# Patient Record
Sex: Female | Born: 1955 | Race: White | Hispanic: No | Marital: Married | Smoking: Never smoker
Health system: Southern US, Community
[De-identification: ages and names within clinical notes are randomized; demographics above are authoritative.]

## PROBLEM LIST (undated history)

## (undated) DIAGNOSIS — R319 Hematuria, unspecified: Secondary | ICD-10-CM

## (undated) DIAGNOSIS — G4700 Insomnia, unspecified: Secondary | ICD-10-CM

## (undated) DIAGNOSIS — I10 Essential (primary) hypertension: Secondary | ICD-10-CM

## (undated) DIAGNOSIS — E785 Hyperlipidemia, unspecified: Secondary | ICD-10-CM

## (undated) DIAGNOSIS — N951 Menopausal and female climacteric states: Secondary | ICD-10-CM

## (undated) DIAGNOSIS — M8580 Other specified disorders of bone density and structure, unspecified site: Secondary | ICD-10-CM

## (undated) DIAGNOSIS — K649 Unspecified hemorrhoids: Secondary | ICD-10-CM

## (undated) HISTORY — DX: Hyperlipidemia, unspecified: E78.5

## (undated) HISTORY — PX: BREAST SURGERY: SHX581

## (undated) HISTORY — DX: Essential (primary) hypertension: I10

## (undated) HISTORY — DX: Insomnia, unspecified: G47.00

## (undated) HISTORY — DX: Hematuria, unspecified: R31.9

## (undated) HISTORY — DX: Unspecified hemorrhoids: K64.9

## (undated) HISTORY — PX: BREAST EXCISIONAL BIOPSY: SUR124

## (undated) HISTORY — DX: Other specified disorders of bone density and structure, unspecified site: M85.80

## (undated) HISTORY — PX: ABDOMINAL HYSTERECTOMY: SHX81

## (undated) HISTORY — DX: Menopausal and female climacteric states: N95.1

---

## 2000-05-05 ENCOUNTER — Other Ambulatory Visit: Admission: RE | Admit: 2000-05-05 | Discharge: 2000-05-05 | Payer: Self-pay | Admitting: *Deleted

## 2000-08-19 ENCOUNTER — Other Ambulatory Visit: Admission: RE | Admit: 2000-08-19 | Discharge: 2000-08-19 | Payer: Self-pay | Admitting: *Deleted

## 2001-04-18 ENCOUNTER — Encounter (INDEPENDENT_AMBULATORY_CARE_PROVIDER_SITE_OTHER): Payer: Self-pay

## 2001-04-18 ENCOUNTER — Observation Stay (HOSPITAL_COMMUNITY): Admission: RE | Admit: 2001-04-18 | Discharge: 2001-04-19 | Payer: Self-pay | Admitting: *Deleted

## 2001-12-19 ENCOUNTER — Other Ambulatory Visit: Admission: RE | Admit: 2001-12-19 | Discharge: 2001-12-19 | Payer: Self-pay | Admitting: *Deleted

## 2002-09-18 ENCOUNTER — Encounter: Admission: RE | Admit: 2002-09-18 | Discharge: 2002-09-18 | Payer: Self-pay | Admitting: *Deleted

## 2002-09-18 ENCOUNTER — Encounter: Payer: Self-pay | Admitting: *Deleted

## 2002-12-27 ENCOUNTER — Other Ambulatory Visit: Admission: RE | Admit: 2002-12-27 | Discharge: 2002-12-27 | Payer: Self-pay | Admitting: *Deleted

## 2003-10-11 ENCOUNTER — Encounter: Admission: RE | Admit: 2003-10-11 | Discharge: 2003-10-11 | Payer: Self-pay | Admitting: *Deleted

## 2003-12-26 ENCOUNTER — Other Ambulatory Visit: Admission: RE | Admit: 2003-12-26 | Discharge: 2003-12-26 | Payer: Self-pay | Admitting: *Deleted

## 2004-02-21 ENCOUNTER — Encounter: Admission: RE | Admit: 2004-02-21 | Discharge: 2004-02-21 | Payer: Self-pay | Admitting: Family Medicine

## 2004-11-23 ENCOUNTER — Encounter: Admission: RE | Admit: 2004-11-23 | Discharge: 2004-11-23 | Payer: Self-pay | Admitting: *Deleted

## 2004-12-30 ENCOUNTER — Other Ambulatory Visit: Admission: RE | Admit: 2004-12-30 | Discharge: 2004-12-30 | Payer: Self-pay | Admitting: *Deleted

## 2005-01-08 ENCOUNTER — Encounter: Admission: RE | Admit: 2005-01-08 | Discharge: 2005-01-08 | Payer: Self-pay | Admitting: *Deleted

## 2005-12-22 ENCOUNTER — Encounter: Admission: RE | Admit: 2005-12-22 | Discharge: 2005-12-22 | Payer: Self-pay | Admitting: *Deleted

## 2006-01-04 ENCOUNTER — Other Ambulatory Visit: Admission: RE | Admit: 2006-01-04 | Discharge: 2006-01-04 | Payer: Self-pay | Admitting: *Deleted

## 2006-12-27 ENCOUNTER — Encounter: Admission: RE | Admit: 2006-12-27 | Discharge: 2006-12-27 | Payer: Self-pay | Admitting: *Deleted

## 2007-01-11 ENCOUNTER — Other Ambulatory Visit: Admission: RE | Admit: 2007-01-11 | Discharge: 2007-01-11 | Payer: Self-pay | Admitting: *Deleted

## 2008-02-05 ENCOUNTER — Other Ambulatory Visit: Admission: RE | Admit: 2008-02-05 | Discharge: 2008-02-05 | Payer: Self-pay | Admitting: Gynecology

## 2008-02-05 ENCOUNTER — Encounter: Admission: RE | Admit: 2008-02-05 | Discharge: 2008-02-05 | Payer: Self-pay | Admitting: Gynecology

## 2008-02-09 ENCOUNTER — Encounter: Admission: RE | Admit: 2008-02-09 | Discharge: 2008-02-09 | Payer: Self-pay | Admitting: Gynecology

## 2008-02-14 ENCOUNTER — Encounter: Admission: RE | Admit: 2008-02-14 | Discharge: 2008-02-14 | Payer: Self-pay | Admitting: Gynecology

## 2008-02-14 ENCOUNTER — Encounter (INDEPENDENT_AMBULATORY_CARE_PROVIDER_SITE_OTHER): Payer: Self-pay | Admitting: Diagnostic Radiology

## 2009-03-06 ENCOUNTER — Encounter: Admission: RE | Admit: 2009-03-06 | Discharge: 2009-03-06 | Payer: Self-pay | Admitting: Gynecology

## 2009-03-11 ENCOUNTER — Encounter: Admission: RE | Admit: 2009-03-11 | Discharge: 2009-03-11 | Payer: Self-pay | Admitting: Gynecology

## 2009-03-18 ENCOUNTER — Encounter: Admission: RE | Admit: 2009-03-18 | Discharge: 2009-03-18 | Payer: Self-pay | Admitting: Gynecology

## 2010-03-09 ENCOUNTER — Encounter: Admission: RE | Admit: 2010-03-09 | Discharge: 2010-03-09 | Payer: Self-pay | Admitting: Family Medicine

## 2010-06-15 ENCOUNTER — Encounter: Admission: RE | Admit: 2010-06-15 | Discharge: 2010-06-15 | Payer: Self-pay | Admitting: Family Medicine

## 2010-12-18 NOTE — Op Note (Signed)
[facility]  Patient:    Laura Cohen, Laura Cohen Visit Number: [id number] MRN: [id number]          Service Type: DSU Location: DAY Attending Physician:  Collene Schlichter Proc. Date: 04/18/01 Admit Date:  04/18/2001                             Operative Report  PREOPERATIVE DIAGNOSES:  Abnormal uterine bleeding, uterine enlargement, pelvic pain, persisting ovarian cyst, and endometriosis.  POSTOPERATIVE DIAGNOSES:  Abnormal uterine bleeding, uterine enlargement, pelvic pain, persisting ovarian cyst, and endometriosis pending pathology.  PROCEDURE PERFORMED:  Laparoscopically assisted vaginal hysterectomy, bilateral salpingo-oophorectomy.  SURGEON:  Almedia Balls. Randell Patient, M.D.  ASSISTANT:  Harl Bowie, M.D.  INDICATIONS:  The patient is a 55 year old female with the above noted problems, who was counseled as to the need for surgery to treat these problems.  She was fully counseled as to the nature of the procedure and the risks involved to include risk of anesthesia, injury to bowel, bladder, vessels, and ureter, postoperative hemorrhage, infection, recuperation, and use of hormone replacement following removal of the ovaries, which she definitely desired.  She fully understands all these considerations and wishes to proceed on 04/18/01.  FINDINGS:  On laparoscopy, the lower liver edge, spleen, and area of the appendix were normal to visualization.  The uterus was approximately 10-[redacted] weeks gestational size with multiple myomata present.  Both ovaries had cyst, which appeared to be corpus luteum cyst.  There are adhesions involving the right adnexal structures to the posterolateral peritoneum and cul-de-sac areas.  These were thought to be secondary to probable endometriosis.  DESCRIPTION OF PROCEDURE:  With the patient under general anesthesia, prepared and draped in the usual sterile fashion, with an empty bladder post catheterization and a tenaculum on  the cervix, an incision was made in the lower pole of the umbilicus.  The Veress cannula was inserted into the peritoneal cavity with insufflation of 3 liters of carbon dioxide.  The reuseable 10 mm trocar for the operative scope, and the scope itself was then inserted into the peritoneal cavity.  The reuseable 5 mm probe was inserted through a stab wound just above the symphysis pubis.  The above noted findings were visualized.  Bipolar electrocoagulation was used to render the infundibulopelvic ligaments and lateral peritoneal attachments hemostatic both adnexal areas, these structures were sequentially cut free with good hemostasis throughout.  A bladder flap was developed on the anterior surface of the uterus using bipolar electrocoagulation for hemostasis and sharp dissection for creation of the bladder flap.  At this point, with good hemostasis in the operative area, the patient was reprepared and draped for a vaginal procedure.  A weighted speculum was placed in the posterior vagina and the cervix was grasped with two Loman [place].  A solution of 1% lidocaine with 1:200,000 epinephrine was injected circumferentially on the cervix.  An incision was made from the 3 to 9 oclock positions and the vaginal mucosa anteriorly with dissection of the bladder off the cervix and lower uterine segment.  The posterior cul-de-sac was then entered as well.  Heaney clamps were used to clamp in succession the uterosacral ligaments, cardinal ligaments, and uterine vessels bilaterally, these structures were individually suture ligated after being cut free of the cervix and lower uterine segment.  The anterior cul-de-sac was then entered without difficulty.  It was then possible to invert the uterus, and the remaining portions of broad  ligament were clamped, cut, and ligated with 1-0 chromic catgut.  The area was lavaged with copious amounts of lactated Ringers solution, and after noting that  hemostasis was maintained and that sponge and instrument counts were correct, a modified McCall suture was placed for closure of the posterior cul-de-sac and prevention of enterocele.  The peritoneum was closed with a pursestring suture of #1 chromic catgut.  The vaginal cuff was rendered hemostatic and reapproximated with a continuous interlocking suture of #1 chromic catgut. The patient was then catheterized with free flow of urine, and prepared and draped for a laparoscopy procedure.  After changing gloves, the laparoscope was reinserted into the peritoneal cavity with insufflation of 3 liters of carbon dioxide.  The surgical site was inspected after lavage with copious amounts of lactated Ringers solution for hemostasis.  After noting that this was the case and that sponge and instrument counts were correct, the procedure was terminated.  Gas was allowed to fully escape and the incisions were closed with fascial sutures of 2-0 Vicryl and subcuticular sutures of 3-0 plain catgut.  Estimated blood loss was 250 ml.  The patient was taken to the recovery room in good condition following catheterization and free flow of clear urine.  She will be placed on a 23-hour observation following surgery. Attending Physician:  Collene Schlichter DD:  04/18/01 TD:  04/18/01 Job: [id number]

## 2010-12-18 NOTE — H&P (Signed)
[facility]  Patient:    Laura Cohen, Laura Cohen Visit Number: [id number] MRN: [id number]          Service Type: Attending:  Almedia Balls. Randell Patient, M.D. Dictated by:   Almedia Balls Randell Patient, M.D. Adm. Date:  04/18/01                           History and Physical  CHIEF COMPLAINT:  Pain, uterine enlargement, abnormal bleeding.  HISTORY OF PRESENT ILLNESS:  The patient is a 55 year old gravida 3, para 3, whose last menstrual period was March 31, 2001.  Over the past several years she has had increasingly severe menses with pain and heavy bleeding, and has had continued pressure and pain throughout the month.  Pap smear was normal in October 2001.  She underwent hysteroscopy, D&C, and laparoscopy in January 2002, with findings of extensive endometriosis, ovarian cyst, uterine enlargement.  Pathology was benign on the D&C tissue.  She is admitted at this time for hysterectomy, bilateral salpingo-oophorectomy.  She has been fully counseled as to the nature of the procedure, to include risks, including anesthesia, injury to bowel, bladder, blood vessels, ureters, postoperative hemorrhage, infection, and recuperation.  She fully understands all these considerations, and wishes to proceed on April 18, 2001.  PAST MEDICAL HISTORY:  The above noted surgery.  ALLERGIES:  No known drug allergies.  CURRENT MEDICATIONS:  Has taken multiple analgesics for pain.  FAMILY HISTORY:  Noncontributory.  REVIEW OF SYSTEMS:  HEENT:  Negative.  CARDIORESPIRATORY:  Negative.  GASTROINTESTINAL:  Negative.  GENITOURINARY:  As in present illness.  NEUROMUSCULAR:  Negative.  PHYSICAL EXAMINATION:  VITAL SIGNS:  Height 5 feet 4 inches, weight 162 pounds, blood pressure 132/78, pulse 80, respiratory rate 18.  GENERAL:  A well-developed white female in no acute distress.  HEENT:  Within normal limits.  NECK:  Supple without masses and adenopathy or bruits.  HEART:  Regular rate  and rhythm without murmurs.  LUNGS:  Clear to auscultation and percussion.  BREASTS:  Sitting and laying without mass.  Axilla negative.  ABDOMEN:  Flat and soft without mass, slightly tender in lower quadrants.  PELVIC:  External genitalia, Bartholins, urethra, and Skeens glands within normal limits.  Cervix slightly inflamed.  Uterus mid position approximately [redacted] weeks gestational size, irregular, no definite palpable adnexal masses, but tender right greater than left.  Anterior and posterior cul-de-sac examination is confirmatory.  EXTREMITIES:  Within normal limits.  SKIN:  Without suspicious lesions.  IMPRESSION:  Uterine enlargement, abnormal uterine bleeding, pelvic pain.  DISPOSITION:  As noted above. Dictated by:   Almedia Balls Randell Patient, M.D. Attending:  Almedia Balls. Randell Patient, M.D. DD:  04/13/01 TD:  04/13/01 Job: [id number]

## 2010-12-18 NOTE — Discharge Summary (Signed)
[facility]  Patient:    Laura Cohen, Laura Cohen Visit Number: [id number] MRN: [id number]          Service Type: SUR Location: 4W 0451 01 Attending Physician:  Collene Schlichter Dictated by:   Almedia Balls Randell Patient, M.D. Admit Date:  04/18/2001 Disc. Date: 04/19/01                             Discharge Summary  HISTORY:  The patient is a 55 year old with uterine enlargement, abnormal uterine bleeding, pelvic pain, endometriosis, recurring ovarian cyst for hysterectomy, bilateral salpingo-oophorectomy. The remainder of her history and physical are as previously dictated.  LABORATORY DATA:  Preoperative hemoglobin 15 grams.  HOSPITAL COURSE:  The patient was taken to the operating room on April 18, 2001 at which time laparoscopically assisted vaginal hysterectomy, bilateral salpingo-oophorectomy were performed. The patient did well postoperatively. Diet and ambulation were progressed over the evening of September 17 and early morning of September 18.  On the morning of September 18, she was afebrile, up and voiding without difficulty, and experiencing no problems other than pain which was relieved by pain medications orally. It was felt that she could be discharged at this time.  FINAL DIAGNOSES:              1. Uterine enlargement.                               2. Abnormal uterine bleeding.                               3. History of endometriosis.                               4. Pelvic pain.                               5. Recurring ovarian cyst.  OPERATION:                    Laparoscopically assisted vaginal hysterectomy and bilateral salpingo-oophorectomy.  PATHOLOGY REPORT:             Unavailable at the time of dictation.  DISPOSITION:                  Discharged home.  DISCHARGE FOLLOWUP:           The patient is to return to the office in two weeks for followup.  DISCHARGE INSTRUCTIONS:       The patient was instructed to gradually  progress her activities over several weeks at home and to limit lifting and driving for two weeks. She will call for any problems.  CONDITION ON DISCHARGE        She was fully ambulatory, on a regular diet, and in good condition at the time of discharge.  DISCHARGE MEDICATIONS:        The patient had Darvocet-N 100 at home and was advised to combine this with Ibuprofen for pain relief. She was given a prescription for doxycycline 100 mg, #12, to be taken one b.i.d. Dictated by:   Almedia Balls Randell Patient, M.D. Attending Physician:  Collene Schlichter DD:  04/19/01 TD:  04/19/01 Job: [id number]

## 2011-03-25 ENCOUNTER — Other Ambulatory Visit: Payer: Self-pay | Admitting: Family Medicine

## 2011-03-25 DIAGNOSIS — Z1231 Encounter for screening mammogram for malignant neoplasm of breast: Secondary | ICD-10-CM

## 2011-04-13 ENCOUNTER — Ambulatory Visit: Payer: Self-pay

## 2011-04-15 ENCOUNTER — Ambulatory Visit: Payer: Self-pay

## 2011-05-04 ENCOUNTER — Ambulatory Visit
Admission: RE | Admit: 2011-05-04 | Discharge: 2011-05-04 | Disposition: A | Discharge status: Home / Self Care | Payer: No Typology Code available for payment source | Source: Ambulatory Visit | Attending: Family Medicine | Admitting: Family Medicine

## 2011-05-04 DIAGNOSIS — Z1231 Encounter for screening mammogram for malignant neoplasm of breast: Secondary | ICD-10-CM

## 2012-04-24 ENCOUNTER — Other Ambulatory Visit: Payer: Self-pay | Admitting: Family Medicine

## 2012-04-24 DIAGNOSIS — Z1231 Encounter for screening mammogram for malignant neoplasm of breast: Secondary | ICD-10-CM

## 2012-05-29 ENCOUNTER — Ambulatory Visit: Payer: No Typology Code available for payment source

## 2012-05-29 ENCOUNTER — Ambulatory Visit
Admission: RE | Admit: 2012-05-29 | Discharge: 2012-05-29 | Disposition: A | Discharge status: Home / Self Care | Payer: No Typology Code available for payment source | Source: Ambulatory Visit | Attending: Family Medicine | Admitting: Family Medicine

## 2012-05-29 DIAGNOSIS — Z1231 Encounter for screening mammogram for malignant neoplasm of breast: Secondary | ICD-10-CM

## 2012-06-26 ENCOUNTER — Other Ambulatory Visit: Payer: Self-pay | Admitting: Family Medicine

## 2012-06-26 DIAGNOSIS — M8580 Other specified disorders of bone density and structure, unspecified site: Secondary | ICD-10-CM

## 2012-06-28 ENCOUNTER — Ambulatory Visit
Admission: RE | Admit: 2012-06-28 | Discharge: 2012-06-28 | Disposition: A | Discharge status: Home / Self Care | Payer: No Typology Code available for payment source | Source: Ambulatory Visit | Attending: Family Medicine | Admitting: Family Medicine

## 2012-06-28 DIAGNOSIS — M8580 Other specified disorders of bone density and structure, unspecified site: Secondary | ICD-10-CM

## 2013-02-27 ENCOUNTER — Encounter: Payer: Self-pay | Admitting: Dietician

## 2013-02-27 ENCOUNTER — Encounter: Payer: No Typology Code available for payment source | Attending: Family Medicine | Admitting: Dietician

## 2013-02-27 VITALS — Ht 63.0 in | Wt 159.6 lb

## 2013-02-27 DIAGNOSIS — E785 Hyperlipidemia, unspecified: Secondary | ICD-10-CM | POA: Insufficient documentation

## 2013-02-27 DIAGNOSIS — Z713 Dietary counseling and surveillance: Secondary | ICD-10-CM | POA: Insufficient documentation

## 2013-02-27 DIAGNOSIS — E663 Overweight: Secondary | ICD-10-CM | POA: Insufficient documentation

## 2013-02-27 NOTE — Patient Instructions (Signed)
Eat 3 meals per day and several snacks if needed.  Pay attention to hunger and fullness cues. Buy fruits and vegetables and have it prepared "ready to go the refrigerator". Eat in proportion to MyPlate guidelines. Check labels to limit saturated fat and avoid trans fats. Consider limiting diet coke for bone health. Add physical activity you like. Eat foods with calcium and talk to your calcium/vitamin D supplements.

## 2013-02-27 NOTE — Progress Notes (Signed)
Medical Nutrition Therapy:  Appt start time: 1015 end time:  1115.   Assessment:  Primary concerns today: Referred for high cholesterol and would prefer not to be on medication since the medication made her body ache. Is currently taking Fosamax for osteopenia which is upsetting stomach so is going to talk to the doctor about changing the dose or medication. Laura Cohen is also interested in losing about 20 pounds. She states that she gained some weight over the years with a high of 174 pounds. Last summer she lost about 10 pounds on Weight Watchers but reports that she lost motivation over the winter.  Laura Cohen works at [employer] of [place] and lives with with husband and states that she does the food shopping and cooking. States that she knows what to do to be healthy but has difficulty with finding the motivation to plan and prepare meals. Claims to be a "picky eater" with a sensitive stomach. Will sometimes skip lunch and will be very hungry after work.   Currently walks about 25 minutes a few times a week, but would like to find the motivation to do more[place] a buddy to exercise with.    MEDICATIONS: See list   DIETARY INTAKE:  Everyday foods include diet coke.   Avoided foods include most vegetables, green beans, fish   24-hr recall:  B ( AM): steel cut oatmeal with coffee with fat free half and half, stevia, and splenda, pomegranate juice  Snk ( AM): crackers or a piece of fruit, granola bar, [place] yogurt sometimes nothing  L ( PM): salad with cucumber, radishes, dried cranberries, sometimes chicken, fat free dressing, [place] style OR boneless chicken wings with BBQ sauce and chips and diet coke  Snk ( PM): sometimes crackers with peanut butter or chips  D ( PM): macaroni and cheese, pork chop, pinto beans, hot dog, pizza with decaf tea with splenda and sugar Snk ( PM): sometimes fruit, cheddar and carmel popocorn Beverages: drinks 3, 16 oz diet  cokes each day, sometimes sparkling water  Usual physical activity: walk for 25 minutes 2 x week  Estimated energy needs: 1600 calories 180 g carbohydrates 120 g protein 44 g fat  Progress Towards Goal(s):  In progress.   Nutritional Diagnosis:  NB-1.1 Food and nutrition-related knowledge deficit As related to meal skipping, lack of meal planning,  and energy dense food choices.  As evidenced by hyperlipidemia and BMI of 28.3.    Intervention:  Nutrition counseling provided. Discussed limiting diet coke intake d/t impact on bones, adding weight bearing exercises, planning for meals and snacks in advance so that healthy foods are available to "grab and go". Emphasized the importance of having three meals and snacks each day to prevent late day hunger.  Discussed eating meals in proportion to MyPlate and including plenty of calcium rich foods for bone health. Recommended discussing calcium and vitamin D supplements with doctor. Reviewed the food label and advised to avoid trans fat, limit saturated fats, and add heart healthy fats from walnuts and flax seeds since she doesn't like fish.   Plan: Eat 3 meals per day and several snacks if needed.  Pay attention to hunger and fullness cues. Buy fruits and vegetables and have it prepared "ready to go the refrigerator". Eat in proportion to MyPlate guidelines. Check labels to limit saturated fat and avoid trans fats. Consider limiting diet coke for bone health. Add physical activity you like. Eat foods  with calcium and talk to your calcium/vitamin D supplements.   Handouts given during visit include:  MyPlate Handout  High Cholesterol Diet from eatright.org  Food label Handout  High Calcium Foods  Monitoring/Evaluation:  Dietary intake, exercise, and body weight in 4 week(s).

## 2013-03-27 ENCOUNTER — Ambulatory Visit: Payer: No Typology Code available for payment source | Admitting: Dietician

## 2013-04-30 ENCOUNTER — Other Ambulatory Visit: Payer: Self-pay

## 2013-04-30 DIAGNOSIS — Z1231 Encounter for screening mammogram for malignant neoplasm of breast: Secondary | ICD-10-CM

## 2013-05-30 ENCOUNTER — Ambulatory Visit
Admission: RE | Admit: 2013-05-30 | Discharge: 2013-05-30 | Disposition: A | Discharge status: Home / Self Care | Payer: No Typology Code available for payment source | Source: Ambulatory Visit

## 2013-05-30 DIAGNOSIS — Z1231 Encounter for screening mammogram for malignant neoplasm of breast: Secondary | ICD-10-CM

## 2013-06-11 ENCOUNTER — Other Ambulatory Visit: Payer: Self-pay | Admitting: Family Medicine

## 2013-06-11 DIAGNOSIS — R928 Other abnormal and inconclusive findings on diagnostic imaging of breast: Secondary | ICD-10-CM

## 2013-06-12 ENCOUNTER — Ambulatory Visit
Admission: RE | Admit: 2013-06-12 | Discharge: 2013-06-12 | Disposition: A | Discharge status: Home / Self Care | Payer: No Typology Code available for payment source | Source: Ambulatory Visit | Attending: Family Medicine | Admitting: Family Medicine

## 2013-06-12 DIAGNOSIS — R928 Other abnormal and inconclusive findings on diagnostic imaging of breast: Secondary | ICD-10-CM

## 2013-06-29 ENCOUNTER — Other Ambulatory Visit: Payer: No Typology Code available for payment source

## 2014-02-16 ENCOUNTER — Emergency Department (HOSPITAL_COMMUNITY): Payer: No Typology Code available for payment source

## 2014-02-16 ENCOUNTER — Emergency Department (HOSPITAL_COMMUNITY)
Admission: EM | Admit: 2014-02-16 | Discharge: 2014-02-16 | Disposition: A | Discharge status: Home / Self Care | Payer: No Typology Code available for payment source | Attending: Emergency Medicine | Admitting: Emergency Medicine

## 2014-02-16 ENCOUNTER — Encounter (HOSPITAL_COMMUNITY): Payer: Self-pay | Admitting: Emergency Medicine

## 2014-02-16 DIAGNOSIS — R209 Unspecified disturbances of skin sensation: Secondary | ICD-10-CM | POA: Insufficient documentation

## 2014-02-16 DIAGNOSIS — I10 Essential (primary) hypertension: Secondary | ICD-10-CM | POA: Insufficient documentation

## 2014-02-16 DIAGNOSIS — Z862 Personal history of diseases of the blood and blood-forming organs and certain disorders involving the immune mechanism: Secondary | ICD-10-CM | POA: Insufficient documentation

## 2014-02-16 DIAGNOSIS — Z8639 Personal history of other endocrine, nutritional and metabolic disease: Secondary | ICD-10-CM | POA: Insufficient documentation

## 2014-02-16 DIAGNOSIS — H5710 Ocular pain, unspecified eye: Secondary | ICD-10-CM | POA: Insufficient documentation

## 2014-02-16 DIAGNOSIS — H579 Unspecified disorder of eye and adnexa: Secondary | ICD-10-CM | POA: Insufficient documentation

## 2014-02-16 DIAGNOSIS — Z79899 Other long term (current) drug therapy: Secondary | ICD-10-CM | POA: Insufficient documentation

## 2014-02-16 LAB — CBC
HCT: 42.1 % (ref 36.0–46.0)
Hemoglobin: 14.2 g/dL (ref 12.0–15.0)
MCH: 30.5 pg (ref 26.0–34.0)
MCHC: 33.7 g/dL (ref 30.0–36.0)
MCV: 90.3 fL (ref 78.0–100.0)
Platelets: 252 10*3/uL (ref 150–400)
RBC: 4.66 MIL/uL (ref 3.87–5.11)
RDW: 12.9 % (ref 11.5–15.5)
WBC: 9.3 10*3/uL (ref 4.0–10.5)

## 2014-02-16 LAB — COMPREHENSIVE METABOLIC PANEL
ALT: 18 U/L (ref 0–35)
AST: 23 U/L (ref 0–37)
Albumin: 3.8 g/dL (ref 3.5–5.2)
Alkaline Phosphatase: 79 U/L (ref 39–117)
Anion gap: 20 — ABNORMAL HIGH (ref 5–15)
BUN: 11 mg/dL (ref 6–23)
CO2: 24 mEq/L (ref 19–32)
Calcium: 9.1 mg/dL (ref 8.4–10.5)
Chloride: 94 mEq/L — ABNORMAL LOW (ref 96–112)
Creatinine, Ser: 0.65 mg/dL (ref 0.50–1.10)
GFR calc Af Amer: >90 mL/min (ref >90)
GFR calc non Af Amer: >90 mL/min (ref >90)
Glucose, Bld: 111 mg/dL — ABNORMAL HIGH (ref 70–99)
Potassium: 3.4 mEq/L — ABNORMAL LOW (ref 3.7–5.3)
Sodium: 138 mEq/L (ref 137–147)
Total Bilirubin: 0.2 mg/dL — ABNORMAL LOW (ref 0.3–1.2)
Total Protein: 7.3 g/dL (ref 6.0–8.3)

## 2014-02-16 LAB — I-STAT VENOUS BLOOD GAS, ED
Acid-Base Excess: 3 mmol/L — ABNORMAL HIGH (ref 0.0–2.0)
Bicarbonate: 28.1 mEq/L — ABNORMAL HIGH (ref 20.0–24.0)
O2 Saturation: 41 %
TCO2: 29 mmol/L (ref 0–100)
pCO2, Ven: 45.2 mmHg (ref 45.0–50.0)
pH, Ven: 7.402 — ABNORMAL HIGH (ref 7.250–7.300)
pO2, Ven: 23 mmHg — CL (ref 30.0–45.0)

## 2014-02-16 LAB — I-STAT TROPONIN, ED: Troponin i, poc: 0.01 ng/mL (ref 0.00–0.08)

## 2014-02-16 LAB — DIFFERENTIAL
Basophils Absolute: 0 10*3/uL (ref 0.0–0.1)
Basophils Relative: 0 % (ref 0–1)
Eosinophils Absolute: 0.1 10*3/uL (ref 0.0–0.7)
Eosinophils Relative: 1 % (ref 0–5)
Lymphocytes Relative: 14 % (ref 12–46)
Lymphs Abs: 1.3 10*3/uL (ref 0.7–4.0)
Monocytes Absolute: 0.6 10*3/uL (ref 0.1–1.0)
Monocytes Relative: 6 % (ref 3–12)
Neutro Abs: 7.4 10*3/uL (ref 1.7–7.7)
Neutrophils Relative %: 79 % — ABNORMAL HIGH (ref 43–77)

## 2014-02-16 LAB — APTT: aPTT: 32 seconds (ref 24–37)

## 2014-02-16 LAB — PROTIME-INR
INR: 0.97 (ref 0.00–1.49)
Prothrombin Time: 12.9 seconds (ref 11.6–15.2)

## 2014-02-16 LAB — CBG MONITORING, ED: Glucose-Capillary: 107 mg/dL — ABNORMAL HIGH (ref 70–99)

## 2014-02-16 MED ORDER — PSEUDOEPHEDRINE HCL 60 MG PO TABS
60.0000 mg | ORAL_TABLET | Freq: Once | ORAL | Status: DC
  Filled 2014-02-16 (×2): qty 1

## 2014-02-16 MED ORDER — TETRACAINE HCL 0.5 % OP SOLN
2.0000 [drp] | Freq: Once | OPHTHALMIC | Status: DC
  Filled 2014-02-16: qty 2

## 2014-02-16 NOTE — ED Provider Notes (Signed)
CSN: [id number]     Arrival date & time 02/16/14  1415 History   First MD Initiated Contact with Patient 02/16/14 1604     Chief Complaint  Patient presents with  . Weakness  . Blurred Vision     (Consider location/radiation/quality/duration/timing/severity/associated sxs/prior Treatment) Patient is a 58 y.o. female presenting with eye pain.  Eye Pain This is a new problem. The current episode started today. The problem occurs constantly. The problem has been resolved. Pertinent negatives include no abdominal pain, chest pain, congestion, coughing, headaches, nausea or vomiting. Exacerbated by: screen time     Past Medical History  Diagnosis Date  . Hypertension   . Hyperlipidemia    Past Surgical History  Procedure Laterality Date  . Breast surgery    . Abdominal hysterectomy     Family History  Problem Relation Age of Onset  . Hypertension Other    History  Substance Use Topics  . Smoking status: Not on file  . Smokeless tobacco: Not on file  . Alcohol Use: No   OB History   Grav Para Term Preterm Abortions TAB SAB Ect Mult Living                 Review of Systems  Constitutional: Negative for activity change.  HENT: Negative for congestion.   Eyes: Positive for pain and visual disturbance.       Eye pressure   Respiratory: Negative for cough and shortness of breath.   Cardiovascular: Negative for chest pain and leg swelling.  Gastrointestinal: Negative for nausea, vomiting, abdominal pain, diarrhea, constipation, blood in stool and abdominal distention.  Genitourinary: Negative for dysuria, flank pain and vaginal discharge.  Musculoskeletal: Negative for back pain.  Skin: Negative for color change.  Neurological: Negative for syncope and headaches.  Psychiatric/Behavioral: Negative for agitation.  All other systems reviewed and are negative.     Allergies  Review of patient's allergies indicates no known allergies.  Home Medications   Prior to  Admission medications   Medication Sig Start Date End Date Taking? Authorizing Provider  estradiol (VIVELLE-DOT) 0.025 MG/24HR Place 1 patch onto the skin 2 (two) times a week.   Yes Historical Provider, MD  ibuprofen (ADVIL,MOTRIN) 200 MG tablet Take 400 mg by mouth every 6 (six) hours as needed.   Yes Historical Provider, MD  lisinopril-hydrochlorothiazide (PRINZIDE,ZESTORETIC) 10-12.5 MG per tablet Take 1 tablet by mouth daily. 01/18/14  Yes Historical Provider, MD  pseudoephedrine (SUDAFED) 60 MG tablet Take 60 mg by mouth daily as needed for congestion.   Yes Historical Provider, MD   BP 125/57  Pulse 83  Temp(Src) 99.3 F (37.4 C) (Oral)  Resp 20  Ht 5\' 3"  (1.6 m)  Wt 160 lb (72.576 kg)  BMI 28.35 kg/m2  SpO2 98% Physical Exam  Constitutional: She is oriented to person, place, and time. She appears well-developed.  HENT:  Head: Normocephalic.  Eyes: Pupils are equal, round, and reactive to light.  Neck: Neck supple.  Cardiovascular: Normal rate.  Exam reveals no gallop and no friction rub.   No murmur heard. Pulmonary/Chest: Effort normal and breath sounds normal. No respiratory distress.  Abdominal: Soft. She exhibits no distension. There is no tenderness. There is no rebound.  Musculoskeletal: She exhibits no edema.  Neurological: She is alert and oriented to person, place, and time.  Skin: Skin is warm.  Psychiatric: She has a normal mood and affect.   Cranial nerves III-XII grossly intact Strength 5+/5+ to upper and lower extremities  bilaterally with resistance applied, equal distribution noted Strength intact to MCP, PIP, DIP joints of  hand Negative arm drift Fine motor skills intact Heel to knee down shin normal bilaterally Gait proper, proper balance - negative sway, negative drift, negative step-offs  Snell 20/30 both eyes, test independently  ED Course  Procedures (including critical care time) Labs Review Labs Reviewed  DIFFERENTIAL - Abnormal; Notable for  the following:    Neutrophils Relative % 79 (*)    All other components within normal limits  COMPREHENSIVE METABOLIC PANEL - Abnormal; Notable for the following:    Potassium 3.4 (*)    Chloride 94 (*)    Glucose, Bld 111 (*)    Total Bilirubin 0.2 (*)    Anion gap 20 (*)    All other components within normal limits  CBG MONITORING, ED - Abnormal; Notable for the following:    Glucose-Capillary 107 (*)    All other components within normal limits  I-STAT VENOUS BLOOD GAS, ED - Abnormal; Notable for the following:    pH, Ven 7.402 (*)    pO2, Ven 23.0 (*)    Bicarbonate 28.1 (*)    Acid-Base Excess 3.0 (*)    All other components within normal limits  PROTIME-INR  APTT  CBC  BLOOD GAS, VENOUS  I-STAT TROPOININ, ED    Imaging Review Ct Head (brain) Wo Contrast  02/16/2014   CLINICAL DATA:  Blurred vision, left arm weakness.  EXAM: CT HEAD WITHOUT CONTRAST  TECHNIQUE: Contiguous axial images were obtained from the base of the skull through the vertex without intravenous contrast.  COMPARISON:  None.  FINDINGS: Bony calvarium appears intact. No mass effect or midline shift is noted. Ventricular size is within normal limits. There is no evidence of mass lesion, hemorrhage or acute infarction.  IMPRESSION: Normal head CT.   Electronically Signed   By: Sabino Dick M.D.   On: 02/16/2014 15:40     EKG Interpretation None      MDM   Final diagnoses:  Eye pressure   58 year old female with past medical history of hypertension and hyperlipidemia the presents here with addition change in her left eye. Patient change with sudden onset while she was looking at pictures on the phone. Patient states that she has central area of vision blurriness that lasted for approximately 30 minutes. Patient also had some numbness and tingling sensation going down both arms at this time. The numbness and tingling has improved but has not completely resolved the presentation. The blurry vision has  resolved. Patient also endorses some mild pressure-like pain of the eye. Patient is able to read a Snell chart left and right eyes tested independently. Full neuro exam perform without focal neuro deficit. Funduscopic exam does not show obvious blunting of the optic disc or obvious vessel abnormality. Patient evaluated with a head CT which demonstrated no acute abnormality. CMP does demonstrate an anion gap with hypokalemia. The hypokalemia it is very mild patient given by mouth potassium in the department instructed to eat foods higher in potassium at home. VBG ordered to evaluate an ion gap and the patient is noted to not have acidosis.  I had wanted to evaluate the patient with Tono-Pen intraocular pressure. However, patient states that she does not feel that she can tolerate the procedure. Decision making was performed and wrist the benefits were discussed. Patient states that she has an ophthalmologist and will followup as soon as possible. Patient describes relatively minor symptoms and otherwise has a  benign exam. Ocular movements intact without any pain. Patient given return precautions and instructed to please present again if any worsening symptoms. At this time the patient was discharged home    Claudean Severance, MD 02/18/14 [phone]

## 2014-02-16 NOTE — ED Notes (Signed)
Abnormal labs given to Dr Wilson Singer and RN Hassan Rowan

## 2014-02-16 NOTE — ED Notes (Signed)
Pt. Stated, i was sitting under a shelter fling through digital pictures and I notice my left eye began blurry and I had some numbness.  The vision is better but some better and the tingling and numbness is still in my left arm and hand.

## 2014-02-21 NOTE — ED Provider Notes (Signed)
I saw and evaluated the patient, reviewed the resident's note and I agree with the findings and plan.   EKG Interpretation   Date/Time:  Saturday February 16 2014 14:19:13 EDT Ventricular Rate:  73 PR Interval:  122 QRS Duration: 94 QT Interval:  412 QTC Calculation: 453 R Axis:   42 Text Interpretation:  Normal sinus rhythm Nonspecific ST and T wave  abnormality Abnormal ECG ED PHYSICIAN INTERPRETATION AVAILABLE IN [facility] Confirmed by TEST, Record ([id number]) on 02/18/2014 7:17:61 AM      58 year old female with some change in blurred vision and pressure in her left eye. Currently resolved. She does not describe amaurosis fugax. Her examination is unremarkable. No known history of glaucoma. CT head without acute abnormality. Patient did not tolerate testing of intraocular pressure. Tactile tonometry seem normal and symmetric in both eyes for what it's worth. Feel she is stable for discharge at this time. I doubt that this is a TIA. Return precautions were discussed. Ophthalmology followup if has recurrent symptoms.  Virgel Manifold, MD 02/21/14 1236

## 2014-05-16 ENCOUNTER — Other Ambulatory Visit: Payer: Self-pay

## 2014-05-16 DIAGNOSIS — Z1231 Encounter for screening mammogram for malignant neoplasm of breast: Secondary | ICD-10-CM

## 2014-06-06 ENCOUNTER — Ambulatory Visit: Payer: No Typology Code available for payment source

## 2014-06-19 ENCOUNTER — Ambulatory Visit
Admission: RE | Admit: 2014-06-19 | Discharge: 2014-06-19 | Disposition: A | Discharge status: Home / Self Care | Payer: 59 | Source: Ambulatory Visit

## 2014-06-19 DIAGNOSIS — Z1231 Encounter for screening mammogram for malignant neoplasm of breast: Secondary | ICD-10-CM

## 2014-07-31 ENCOUNTER — Other Ambulatory Visit: Payer: Self-pay | Admitting: Family Medicine

## 2014-07-31 DIAGNOSIS — M8580 Other specified disorders of bone density and structure, unspecified site: Secondary | ICD-10-CM

## 2014-08-12 ENCOUNTER — Ambulatory Visit
Admission: RE | Admit: 2014-08-12 | Discharge: 2014-08-12 | Disposition: A | Discharge status: Home / Self Care | Payer: 59 | Source: Ambulatory Visit | Attending: Family Medicine | Admitting: Family Medicine

## 2014-08-12 DIAGNOSIS — M8580 Other specified disorders of bone density and structure, unspecified site: Secondary | ICD-10-CM

## 2014-08-14 ENCOUNTER — Ambulatory Visit (INDEPENDENT_AMBULATORY_CARE_PROVIDER_SITE_OTHER): Payer: 59 | Admitting: Podiatry

## 2014-08-14 ENCOUNTER — Encounter: Payer: Self-pay | Admitting: Podiatry

## 2014-08-14 VITALS — BP 123/75 | HR 77 | Resp 16 | Ht 63.0 in | Wt 170.0 lb

## 2014-08-14 DIAGNOSIS — L600 Ingrowing nail: Secondary | ICD-10-CM

## 2014-08-14 NOTE — Progress Notes (Signed)
   Subjective:    Patient ID: Laura Cohen, female    DOB: 1956/03/12, 59 y.o.   MRN: [id number]  HPI Comments: i have ingrown toenails in both big toes, i have fungus on my little toenail right foot and i had plantar fasciitis on my left foot 1 yr ago. i have had the ingrown nails for 10 yrs. Its worse. i dont know how long ive had the fungus. i have pedicures. i trim my nails. The big toenail on my left foot hurts. i may be interested in the laser. i do not want anything cut or filed today. i do not want any x-rays for the heel because it no longer hurts. All i want is information today.     Review of Systems  Gastrointestinal:       Blood in urine  All other systems reviewed and are negative.      Objective:   Physical Exam: I have reviewed her past medical history medications allergy surgery social history and review of systems. Pulses are strongly palpable bilateral. Neurologic sensorium is intact to Semmes-Weinstein monofilament. Deep tendon reflexes are intact bilateral. Muscle strength is 5 over 5 dorsiflexion plantar flexors and inverters and everters all edges of musculature is intact. Orthopedic evaluation demonstrates mild HAV deformity hammertoe deformities bilateral. Sharp incurvated nail margins to the tibial border of the hallux left with really tender on palpation. No signs of cutaneous mycotic skin infection.        Assessment & Plan:  Assessment: Ingrown nail hallux left.  Plan: I may suggestions perhaps she could take care of this. She did not want to have anything done today.

## 2015-08-13 ENCOUNTER — Other Ambulatory Visit: Payer: Self-pay | Admitting: Family Medicine

## 2015-08-13 DIAGNOSIS — Z1231 Encounter for screening mammogram for malignant neoplasm of breast: Secondary | ICD-10-CM

## 2015-08-27 ENCOUNTER — Ambulatory Visit
Admission: RE | Admit: 2015-08-27 | Discharge: 2015-08-27 | Disposition: A | Discharge status: Home / Self Care | Payer: 59 | Source: Ambulatory Visit | Attending: Family Medicine | Admitting: Family Medicine

## 2015-08-27 DIAGNOSIS — Z1231 Encounter for screening mammogram for malignant neoplasm of breast: Secondary | ICD-10-CM

## 2016-06-11 ENCOUNTER — Other Ambulatory Visit: Payer: Self-pay | Admitting: Family Medicine

## 2016-06-11 DIAGNOSIS — Z1231 Encounter for screening mammogram for malignant neoplasm of breast: Secondary | ICD-10-CM

## 2016-08-06 DIAGNOSIS — Z20828 Contact with and (suspected) exposure to other viral communicable diseases: Secondary | ICD-10-CM | POA: Diagnosis not present

## 2016-09-01 ENCOUNTER — Ambulatory Visit
Admission: RE | Admit: 2016-09-01 | Discharge: 2016-09-01 | Disposition: A | Discharge status: Home / Self Care | Payer: 59 | Source: Ambulatory Visit | Attending: Family Medicine | Admitting: Family Medicine

## 2016-09-01 DIAGNOSIS — Z1231 Encounter for screening mammogram for malignant neoplasm of breast: Secondary | ICD-10-CM

## 2016-09-02 ENCOUNTER — Other Ambulatory Visit: Payer: Self-pay | Admitting: Family Medicine

## 2016-09-02 DIAGNOSIS — R928 Other abnormal and inconclusive findings on diagnostic imaging of breast: Secondary | ICD-10-CM

## 2016-09-06 ENCOUNTER — Other Ambulatory Visit: Payer: Self-pay | Admitting: Family Medicine

## 2016-09-06 ENCOUNTER — Telehealth: Payer: Self-pay | Admitting: Diagnostic Radiology

## 2016-09-06 ENCOUNTER — Ambulatory Visit
Admission: RE | Admit: 2016-09-06 | Discharge: 2016-09-06 | Disposition: A | Discharge status: Home / Self Care | Payer: 59 | Source: Ambulatory Visit | Attending: Family Medicine | Admitting: Family Medicine

## 2016-09-06 DIAGNOSIS — N6311 Unspecified lump in the right breast, upper outer quadrant: Secondary | ICD-10-CM | POA: Diagnosis not present

## 2016-09-06 DIAGNOSIS — R928 Other abnormal and inconclusive findings on diagnostic imaging of breast: Secondary | ICD-10-CM

## 2016-09-06 DIAGNOSIS — N6001 Solitary cyst of right breast: Secondary | ICD-10-CM | POA: Diagnosis not present

## 2016-09-06 DIAGNOSIS — R921 Mammographic calcification found on diagnostic imaging of breast: Secondary | ICD-10-CM | POA: Diagnosis not present

## 2016-09-06 DIAGNOSIS — N6313 Unspecified lump in the right breast, lower outer quadrant: Secondary | ICD-10-CM | POA: Diagnosis not present

## 2016-09-08 ENCOUNTER — Other Ambulatory Visit: Payer: Self-pay | Admitting: Family Medicine

## 2016-09-08 ENCOUNTER — Ambulatory Visit
Admission: RE | Admit: 2016-09-08 | Discharge: 2016-09-08 | Disposition: A | Discharge status: Home / Self Care | Payer: 59 | Source: Ambulatory Visit | Attending: Family Medicine | Admitting: Family Medicine

## 2016-09-08 DIAGNOSIS — N6011 Diffuse cystic mastopathy of right breast: Secondary | ICD-10-CM | POA: Diagnosis not present

## 2016-09-08 DIAGNOSIS — R921 Mammographic calcification found on diagnostic imaging of breast: Secondary | ICD-10-CM

## 2016-09-29 DIAGNOSIS — I10 Essential (primary) hypertension: Secondary | ICD-10-CM | POA: Diagnosis not present

## 2016-09-29 DIAGNOSIS — Z0000 Encounter for general adult medical examination without abnormal findings: Secondary | ICD-10-CM | POA: Diagnosis not present

## 2016-09-30 ENCOUNTER — Other Ambulatory Visit: Payer: Self-pay | Admitting: Family Medicine

## 2016-09-30 DIAGNOSIS — M8588 Other specified disorders of bone density and structure, other site: Secondary | ICD-10-CM

## 2016-10-13 ENCOUNTER — Ambulatory Visit
Admission: RE | Admit: 2016-10-13 | Discharge: 2016-10-13 | Disposition: A | Discharge status: Home / Self Care | Payer: 59 | Source: Ambulatory Visit | Attending: Family Medicine | Admitting: Family Medicine

## 2016-10-13 DIAGNOSIS — M8588 Other specified disorders of bone density and structure, other site: Secondary | ICD-10-CM

## 2016-10-13 DIAGNOSIS — Z780 Asymptomatic menopausal state: Secondary | ICD-10-CM | POA: Diagnosis not present

## 2017-01-03 DIAGNOSIS — D122 Benign neoplasm of ascending colon: Secondary | ICD-10-CM | POA: Diagnosis not present

## 2017-01-03 DIAGNOSIS — Z86010 Personal history of colonic polyps: Secondary | ICD-10-CM | POA: Diagnosis not present

## 2017-01-03 DIAGNOSIS — K648 Other hemorrhoids: Secondary | ICD-10-CM | POA: Diagnosis not present

## 2017-01-20 DIAGNOSIS — E782 Mixed hyperlipidemia: Secondary | ICD-10-CM | POA: Diagnosis not present

## 2017-02-14 ENCOUNTER — Other Ambulatory Visit: Payer: Self-pay | Admitting: Family Medicine

## 2017-02-14 DIAGNOSIS — R921 Mammographic calcification found on diagnostic imaging of breast: Secondary | ICD-10-CM

## 2017-03-16 ENCOUNTER — Ambulatory Visit
Admission: RE | Admit: 2017-03-16 | Discharge: 2017-03-16 | Disposition: A | Discharge status: Home / Self Care | Payer: 59 | Source: Ambulatory Visit | Attending: Family Medicine | Admitting: Family Medicine

## 2017-03-16 DIAGNOSIS — R921 Mammographic calcification found on diagnostic imaging of breast: Secondary | ICD-10-CM

## 2017-03-16 DIAGNOSIS — R928 Other abnormal and inconclusive findings on diagnostic imaging of breast: Secondary | ICD-10-CM | POA: Diagnosis not present

## 2017-03-16 DIAGNOSIS — Z23 Encounter for immunization: Secondary | ICD-10-CM | POA: Diagnosis not present

## 2017-03-16 DIAGNOSIS — N6310 Unspecified lump in the right breast, unspecified quadrant: Secondary | ICD-10-CM | POA: Diagnosis not present

## 2017-04-15 ENCOUNTER — Ambulatory Visit
Admission: RE | Admit: 2017-04-15 | Discharge: 2017-04-15 | Disposition: A | Discharge status: Home / Self Care | Payer: 59 | Source: Ambulatory Visit | Attending: Family Medicine | Admitting: Family Medicine

## 2017-04-15 ENCOUNTER — Other Ambulatory Visit: Payer: Self-pay | Admitting: Family Medicine

## 2017-04-15 DIAGNOSIS — M79671 Pain in right foot: Secondary | ICD-10-CM | POA: Diagnosis not present

## 2017-04-15 DIAGNOSIS — M84374A Stress fracture, right foot, initial encounter for fracture: Secondary | ICD-10-CM

## 2017-05-18 DIAGNOSIS — Z23 Encounter for immunization: Secondary | ICD-10-CM | POA: Diagnosis not present

## 2017-06-30 DIAGNOSIS — Z23 Encounter for immunization: Secondary | ICD-10-CM | POA: Diagnosis not present

## 2017-08-12 ENCOUNTER — Other Ambulatory Visit: Payer: Self-pay | Admitting: Family Medicine

## 2017-08-12 DIAGNOSIS — Z1231 Encounter for screening mammogram for malignant neoplasm of breast: Secondary | ICD-10-CM

## 2017-09-06 ENCOUNTER — Ambulatory Visit
Admission: RE | Admit: 2017-09-06 | Discharge: 2017-09-06 | Disposition: A | Discharge status: Home / Self Care | Payer: 59 | Source: Ambulatory Visit | Attending: Family Medicine | Admitting: Family Medicine

## 2017-09-06 DIAGNOSIS — Z1231 Encounter for screening mammogram for malignant neoplasm of breast: Secondary | ICD-10-CM

## 2017-10-12 DIAGNOSIS — I10 Essential (primary) hypertension: Secondary | ICD-10-CM | POA: Diagnosis not present

## 2017-10-12 DIAGNOSIS — Z0000 Encounter for general adult medical examination without abnormal findings: Secondary | ICD-10-CM | POA: Diagnosis not present

## 2017-10-12 DIAGNOSIS — E782 Mixed hyperlipidemia: Secondary | ICD-10-CM | POA: Diagnosis not present

## 2017-11-11 DIAGNOSIS — J019 Acute sinusitis, unspecified: Secondary | ICD-10-CM | POA: Diagnosis not present

## 2018-06-06 DIAGNOSIS — Z23 Encounter for immunization: Secondary | ICD-10-CM | POA: Diagnosis not present

## 2018-08-04 ENCOUNTER — Other Ambulatory Visit: Payer: Self-pay | Admitting: Family Medicine

## 2018-08-04 DIAGNOSIS — Z1231 Encounter for screening mammogram for malignant neoplasm of breast: Secondary | ICD-10-CM

## 2018-09-04 DIAGNOSIS — L821 Other seborrheic keratosis: Secondary | ICD-10-CM | POA: Diagnosis not present

## 2018-09-04 DIAGNOSIS — D485 Neoplasm of uncertain behavior of skin: Secondary | ICD-10-CM | POA: Diagnosis not present

## 2018-09-04 DIAGNOSIS — I788 Other diseases of capillaries: Secondary | ICD-10-CM | POA: Diagnosis not present

## 2018-09-04 DIAGNOSIS — L281 Prurigo nodularis: Secondary | ICD-10-CM | POA: Diagnosis not present

## 2018-09-04 DIAGNOSIS — D1801 Hemangioma of skin and subcutaneous tissue: Secondary | ICD-10-CM | POA: Diagnosis not present

## 2018-10-18 ENCOUNTER — Ambulatory Visit: Payer: 59

## 2018-11-10 ENCOUNTER — Ambulatory Visit: Payer: 59

## 2018-12-28 ENCOUNTER — Ambulatory Visit: Payer: 59

## 2019-07-02 ENCOUNTER — Other Ambulatory Visit: Payer: Self-pay | Admitting: Family Medicine

## 2019-07-02 DIAGNOSIS — M8580 Other specified disorders of bone density and structure, unspecified site: Secondary | ICD-10-CM

## 2019-09-13 ENCOUNTER — Other Ambulatory Visit: Payer: Self-pay | Admitting: Family Medicine

## 2019-09-13 DIAGNOSIS — Z1231 Encounter for screening mammogram for malignant neoplasm of breast: Secondary | ICD-10-CM

## 2019-09-18 ENCOUNTER — Other Ambulatory Visit: Payer: 59

## 2019-09-18 ENCOUNTER — Ambulatory Visit: Payer: 59

## 2019-11-05 ENCOUNTER — Ambulatory Visit: Payer: 59 | Attending: Internal Medicine

## 2019-11-07 ENCOUNTER — Ambulatory Visit
Admission: RE | Admit: 2019-11-07 | Discharge: 2019-11-07 | Disposition: A | Discharge status: Home / Self Care | Payer: 59 | Source: Ambulatory Visit | Attending: Family Medicine | Admitting: Family Medicine

## 2019-11-07 ENCOUNTER — Other Ambulatory Visit: Payer: Self-pay

## 2019-11-07 DIAGNOSIS — M8580 Other specified disorders of bone density and structure, unspecified site: Secondary | ICD-10-CM

## 2019-11-07 DIAGNOSIS — Z1231 Encounter for screening mammogram for malignant neoplasm of breast: Secondary | ICD-10-CM

## 2020-09-24 ENCOUNTER — Other Ambulatory Visit: Payer: Self-pay | Admitting: Family Medicine

## 2020-09-24 DIAGNOSIS — Z1231 Encounter for screening mammogram for malignant neoplasm of breast: Secondary | ICD-10-CM

## 2020-11-25 ENCOUNTER — Ambulatory Visit
Admission: RE | Admit: 2020-11-25 | Discharge: 2020-11-25 | Disposition: A | Discharge status: Home / Self Care | Payer: 59 | Source: Ambulatory Visit | Attending: Family Medicine | Admitting: Family Medicine

## 2020-11-25 ENCOUNTER — Other Ambulatory Visit: Payer: Self-pay

## 2020-11-25 DIAGNOSIS — Z1231 Encounter for screening mammogram for malignant neoplasm of breast: Secondary | ICD-10-CM

## 2021-05-13 DIAGNOSIS — Z23 Encounter for immunization: Secondary | ICD-10-CM | POA: Diagnosis not present

## 2021-05-13 DIAGNOSIS — I10 Essential (primary) hypertension: Secondary | ICD-10-CM | POA: Diagnosis not present

## 2021-06-03 DIAGNOSIS — Z20828 Contact with and (suspected) exposure to other viral communicable diseases: Secondary | ICD-10-CM | POA: Diagnosis not present

## 2021-08-21 DIAGNOSIS — E782 Mixed hyperlipidemia: Secondary | ICD-10-CM | POA: Diagnosis not present

## 2021-08-21 DIAGNOSIS — Z0000 Encounter for general adult medical examination without abnormal findings: Secondary | ICD-10-CM | POA: Diagnosis not present

## 2021-08-21 DIAGNOSIS — Z1159 Encounter for screening for other viral diseases: Secondary | ICD-10-CM | POA: Diagnosis not present

## 2021-08-21 DIAGNOSIS — M8580 Other specified disorders of bone density and structure, unspecified site: Secondary | ICD-10-CM | POA: Diagnosis not present

## 2021-08-21 DIAGNOSIS — I10 Essential (primary) hypertension: Secondary | ICD-10-CM | POA: Diagnosis not present

## 2021-09-02 ENCOUNTER — Other Ambulatory Visit: Payer: Self-pay | Admitting: Family Medicine

## 2021-09-02 DIAGNOSIS — M8580 Other specified disorders of bone density and structure, unspecified site: Secondary | ICD-10-CM

## 2021-09-17 ENCOUNTER — Other Ambulatory Visit: Payer: Self-pay | Admitting: Family Medicine

## 2021-09-17 DIAGNOSIS — Z1231 Encounter for screening mammogram for malignant neoplasm of breast: Secondary | ICD-10-CM

## 2021-09-24 ENCOUNTER — Other Ambulatory Visit: Payer: Self-pay

## 2021-09-24 ENCOUNTER — Encounter: Payer: Self-pay | Admitting: Cardiology

## 2021-09-24 ENCOUNTER — Ambulatory Visit: Payer: Medicare Other | Admitting: Cardiology

## 2021-09-24 ENCOUNTER — Ambulatory Visit (INDEPENDENT_AMBULATORY_CARE_PROVIDER_SITE_OTHER): Payer: Medicare Other

## 2021-09-24 VITALS — BP 140/90 | HR 83 | Ht 63.0 in | Wt 169.0 lb

## 2021-09-24 DIAGNOSIS — I10 Essential (primary) hypertension: Secondary | ICD-10-CM

## 2021-09-24 DIAGNOSIS — R002 Palpitations: Secondary | ICD-10-CM

## 2021-09-24 DIAGNOSIS — E7800 Pure hypercholesterolemia, unspecified: Secondary | ICD-10-CM | POA: Diagnosis not present

## 2021-09-24 NOTE — Patient Instructions (Signed)
Medication Instructions:  The current medical regimen is effective;  continue present plan and medications.  *If you need a refill on your cardiac medications before your next appointment, please call your pharmacy*  Testing/Procedures: [facility] Monitor Instructions  Your physician has requested you wear a ZIO patch monitor for 14 days.  This is a single patch monitor. [employer] supplies one patch monitor per enrollment. Additional stickers are not available. Please do not apply patch if you will be having a Nuclear Stress Test,  Echocardiogram, Cardiac CT, MRI, or Chest Xray during the period you would be wearing the  monitor. The patch cannot be worn during these tests. You cannot remove and re-apply the  ZIO XT patch monitor.  Your ZIO patch monitor will be mailed 3 day USPS to your address on file. It may take 3-5 days  to receive your monitor after you have been enrolled.  Once you have received your monitor, please review the enclosed instructions. Your monitor  has already been registered assigning a specific monitor serial # to you.  Billing and Patient Assistance Program Information  We have supplied [employer] with any of your insurance information on file for billing purposes. [employer] offers a sliding scale Patient Assistance Program for patients that do not have  insurance, or whose insurance does not completely cover the cost of the ZIO monitor.  You must apply for the Patient Assistance Program to qualify for this discounted rate.  To apply, please call [employer] at [phone], select option 4, select option 2, ask to apply for  Patient Assistance Program. Theodore Demark will ask your household income, and how many people  are in your household. They will quote your out-of-pocket cost based on that information.  [employer] will also be able to set up a 82-month, interest-free payment plan if needed.  Applying the monitor   Shave hair from upper left chest.  Hold abrader disc  by orange tab. Rub abrader in 40 strokes over the upper left chest as  indicated in your monitor instructions.  Clean area with 4 enclosed alcohol pads. Let dry.  Apply patch as indicated in monitor instructions. Patch will be placed under collarbone on left  side of chest with arrow pointing upward.  Rub patch adhesive wings for 2 minutes. Remove white label marked "1". Remove the white  label marked "2". Rub patch adhesive wings for 2 additional minutes.  While looking in a mirror, press and release button in center of patch. A small green light will  flash 3-4 times. This will be your only indicator that the monitor has been turned on.  Do not shower for the first 24 hours. You may shower after the first 24 hours.  Press the button if you feel a symptom. You will hear a small click. Record Date, Time and  Symptom in the Patient Logbook.  When you are ready to remove the patch, follow instructions on the last 2 pages of Patient  Logbook. Stick patch monitor onto the last page of Patient Logbook.  Place Patient Logbook in the blue and white box. Use locking tab on box and tape box closed  securely. The blue and white box has prepaid postage on it. Please place it in the mailbox as  soon as possible. Your physician should have your test results approximately 7 days after the  monitor has been mailed back to [facility].  Call [facility] at [phone] if you have questions regarding  your ZIO XT patch  monitor. Call them immediately if you see an orange light blinking on your  monitor.  If your monitor falls off in less than 4 days, contact our Monitor department at [phone].  If your monitor becomes loose or falls off after 4 days call [employer] at [phone] for  suggestions on securing your monitor  Follow-Up: At [facility], you and your health needs are our priority.  As part of our continuing mission to provide you with exceptional heart care, we have  created designated Provider Care Teams.  These Care Teams include your primary Cardiologist (physician) and Advanced Practice Providers (APPs -  Physician Assistants and Nurse Practitioners) who all work together to provide you with the care you need, when you need it.  We recommend signing up for the patient portal called "MyChart".  Sign up information is provided on this After Visit Summary.  MyChart is used to connect with patients for Virtual Visits (Telemedicine).  Patients are able to view lab/test results, encounter notes, upcoming appointments, etc.  Non-urgent messages can be sent to your provider as well.   To learn more about what you can do with MyChart, go to [email or web address].    Your next appointment:   Follow up will be based on the results of the above testing.   Thank you for choosing [facility]!!

## 2021-09-24 NOTE — Addendum Note (Signed)
Addended by: Jerline Pain on: 09/24/2021 09:48 AM   Modules accepted: Level of Service

## 2021-09-24 NOTE — Progress Notes (Unsigned)
Enrolled for [employer] to mail a ZIO XT long term holter monitor to the patients address on file.  

## 2021-09-24 NOTE — Assessment & Plan Note (Signed)
Currently taking lisinopril hydrochlorothiazide 10/12.5 mg every other day.  Her blood pressure was mildly elevated here today.  I asked her to monitor her blood pressures at home.  Obviously if they remain elevated, taking this daily may be helpful.  In the past with her congestion, she has felt some vertigo/dizziness at times.  Not likely related to her blood pressure.

## 2021-09-24 NOTE — Progress Notes (Addendum)
Cardiology Office Note:    Date:  09/24/2021   ID:  Laura, Cohen 10/08/1955, MRN [id number]  PCP:  Mayra Neer, MD  Cardiologist:  Candee Furbish, MD    Referring MD: Mayra Neer, MD   No chief complaint on file.   History of Present Illness:    Laura Cohen is a 66 y.o. female with a hx of hypertension, hyperlipidemia, and obesity here today for the evaluation of palpitations at the request of Dr. Brigitte Pulse. Of note, her mother is Diannia Ruder.   She last saw Dr. Brigitte Pulse 08/21/21 for her annual physical. She complained of palpitations and was referred to cardiology.  Today, she reported feeling palpitations primarily when she is at rest and in the evening. She does not notice the palpitations when she is active. The palpitation episodes are intermittent and brief.   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 She has quit since following regularly with her PCP. She also endorses being infected with the flu 2 weeks ago. Since her infection, she has reduced her caffeine intake from 2 cups to 1 cup of coffee daily.   Recently, she reports feeling short of breath due to congestion. She also experiences lightheadedness which she associates to vertigo.   She takes lisinopril-HCTZ every other day and wonders if she soul be taking it daily. She would prefer to take it daily rather than increase the dose. She does not record her blood pressure at home but does have a wrist cuff machine. 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 She sits for most of the day and does not exercise regularly.   Her business is doing well. She and her husband are building their new house.   She denies any chest pain, headaches, syncope, orthopnea, PND, or lower extremity edema.  Past Medical History:  Diagnosis Date   Hematuria    Hemorrhoids    Hyperlipidemia    Hypertension    Insomnia    Menopausal symptoms    Osteopenia     Past Surgical History:   Procedure Laterality Date   ABDOMINAL HYSTERECTOMY     BREAST EXCISIONAL BIOPSY     BREAST SURGERY      Current Medications: Current Meds  Medication Sig   aspirin EC 81 MG tablet Take 81 mg by mouth daily. Swallow whole.   B Complex Vitamins (VITAMIN B COMPLEX PO) Take by mouth.   diclofenac Sodium (VOLTAREN) 1 % GEL Apply topically 4 (four) times daily.   estradiol (VIVELLE-DOT) 0.025 MG/24HR Place 1 patch onto the skin 2 (two) times a week.   lisinopril-hydrochlorothiazide (PRINZIDE,ZESTORETIC) 10-12.5 MG per tablet Take 1 tablet by mouth every other day.   Multiple Vitamin (MULTIVITAMIN) tablet Take 1 tablet by mouth daily.   Omega-3 Fatty Acids (FISH OIL PO) Take by mouth.   rosuvastatin (CRESTOR) 5 MG tablet Take 5 mg by mouth daily.   Transdermal Base CREA Apply topically.   VITAMIN D, CHOLECALCIFEROL, PO Take by mouth.   VITAMIN E PO Take by mouth.     Allergies:   Penicillins   Social History   Socioeconomic History   Marital status: Married    Spouse name: Not on file   Number of children: Not on file   Years of education: Not on file   Highest education level: Not on file  Occupational History   Not on file  Tobacco Use   Smoking status: Never   Smokeless  tobacco: Not on file  Substance and Sexual Activity   Alcohol use: No    Alcohol/week: 0.0 standard drinks   Drug use: No   Sexual activity: Not on file  Other Topics Concern   Not on file  Social History Narrative   Not on file   Social Determinants of Health   Financial Resource Strain: Not on file  Food Insecurity: Not on file  Transportation Needs: Not on file  Physical Activity: Not on file  Stress: Not on file  Social Connections: Not on file     Family History: The patient's family history includes Breast cancer in her cousin; CAD in her mother; Cancer in her sister; Dementia in her father; Hypertension in her father, mother, and another family member; Melanoma in her brother.  ROS:    Please see the history of present illness.    (+) Palpitations (+) Shortness of breath (+) Congestion (+) Lightheadedness/Vertigo All other systems reviewed and negative.   EKGs/Labs/Other Studies Reviewed:    The following studies were reviewed today: No prior cardiovascular studies  EKG:  EKG was personally reviewed 09/24/21: Sinus rhythm, rate 83 bpm; nonspecific ST-T wave changes  Outside EKG shows sinus rhythm 79 with nonspecific ST-T wave changes, personally reviewed and interpreted.  Prior was notes reviewed.  Prior lab work reviewed.  Recent Labs: No results found for requested labs within last 8760 hours.   Recent Lipid Panel No results found for: CHOL, TRIG, HDL, CHOLHDL, VLDL, LDLCALC, LDLDIRECT  CHA2DS2-VASc Score =   [ ] .  Therefore, the patient's annual risk of stroke is   %.       Physical Exam:    VS:  BP 140/90 (BP Location: Left Arm, Patient Position: Sitting, Cuff Size: Normal)    Pulse 83    Ht 5\' 3"  (1.6 m)    Wt 169 lb (76.7 kg)    SpO2 97%    BMI 29.94 kg/m     Wt Readings from Last 3 Encounters:  09/24/21 169 lb (76.7 kg)  08/14/14 170 lb (77.1 kg)  02/16/14 160 lb (72.6 kg)     GEN:  Well nourished, well developed in no acute distress HEENT: Normal NECK: No JVD; No carotid bruits LYMPHATICS: No lymphadenopathy CARDIAC: RRR, no murmurs, rubs, gallops RESPIRATORY:  Clear to auscultation without rales, wheezing or rhonchi  ABDOMEN: Soft, non-tender, non-distended MUSCULOSKELETAL:  No edema; No deformity  SKIN: Warm and dry NEUROLOGIC:  Alert and oriented x 3 PSYCHIATRIC:  Normal affect   ASSESSMENT:    1. Palpitations   2. Pure hypercholesterolemia   3. Primary hypertension    PLAN:    Palpitations Possible etiologies include PACs or PVCs.  We will go ahead and check a Zio patch monitor to ensure that there are no adverse arrhythmias, atrial fibrillation etc.  Overall she is changed her regimen by stopping Sudafed which she says she  took for several years.  This will help reduce palpitation burden.  Also caffeine reduction etc., good daily exercise walking.  She does state that she would like to lose 20 pounds.  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  We will follow-up with results.  I do not hear any murmurs on exam.  No high risk symptoms such as syncope.  Pure hypercholesterolemia Excellent use of Crestor 5 mg once a day.  Most recent LDL 65, excellent.  Creatinine 0.7 potassium 3.9 ALT 17.  She states she was hesitant to use the  Crestor at 1 point.  I applauded her use of this medication.  Primary hypertension Currently taking lisinopril hydrochlorothiazide 10/12.5 mg every other day.  Her blood pressure was mildly elevated here today.  I asked her to monitor her blood pressures at home.  Obviously if they remain elevated, taking this daily may be helpful.  In the past with her congestion, she has felt some vertigo/dizziness at times.  Not likely related to her blood pressure.       Medication Adjustments/Labs and Tests Ordered: Current medicines are reviewed at length with the patient today.  Concerns regarding medicines are outlined above.  Orders Placed This Encounter  Procedures   LONG TERM MONITOR (3-14 DAYS)   EKG 12-Lead   No orders of the defined types were placed in this encounter.  I,Mykaella Javier,acting as a scribe for [employer], MD.,have documented all relevant documentation on the behalf of Candee Furbish, MD,as directed by  Candee Furbish, MD while in the presence of Candee Furbish, MD.  I, Candee Furbish, MD, have reviewed all documentation for this visit. The documentation on 09/24/21 for the exam, diagnosis, procedures, and orders are all accurate and complete.   Signed, Candee Furbish, MD  09/24/2021 9:47 AM     [facility]

## 2021-09-24 NOTE — Assessment & Plan Note (Signed)
Excellent use of Crestor 5 mg once a day.  Most recent LDL 65, excellent.  Creatinine 0.7 potassium 3.9 ALT 17.  She states she was hesitant to use the Crestor at 1 point.  I applauded her use of this medication.

## 2021-09-24 NOTE — Assessment & Plan Note (Signed)
Possible etiologies include PACs or PVCs.  We will go ahead and check a Zio patch monitor to ensure that there are no adverse arrhythmias, atrial fibrillation etc.  Overall she is changed her regimen by stopping Sudafed which she says she took for several years.  This will help reduce palpitation burden.  Also caffeine reduction etc., good daily exercise walking.  She does state that she would like to lose 20 pounds.  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  We will follow-up with results.  I do not hear any murmurs on exam.  No high risk symptoms such as syncope.

## 2021-09-29 DIAGNOSIS — R002 Palpitations: Secondary | ICD-10-CM | POA: Diagnosis not present

## 2021-10-20 ENCOUNTER — Telehealth: Payer: Self-pay

## 2021-10-20 DIAGNOSIS — R9431 Abnormal electrocardiogram [ECG] [EKG]: Secondary | ICD-10-CM

## 2021-10-20 NOTE — Telephone Encounter (Signed)
Patient is returning call.  °

## 2021-10-20 NOTE — Telephone Encounter (Signed)
Left message for patient to call back  

## 2021-10-20 NOTE — Telephone Encounter (Signed)
Called patient back with results. Ordered echo for patient to have done in the near future. ?

## 2021-10-20 NOTE — Telephone Encounter (Signed)
-----   Message from Jerline Pain, MD sent at 10/20/2021  1:23 PM EDT ----- ?? Sinus rhythm average heart rate 66 bpm. ?? 1 run of nonsustained VT 6 beats average heart rate 118 bpm, slow. ?? 13 brief runs of atrial tachycardia, longest 14 beats with average of 124 bpm ?? Rare PACs, rare PVCs. ?? No atrial fibrillation, no pauses ?? We will go ahead and check an echocardiogram to ensure proper structure and function.-Diagnosis abnormal EKG ?

## 2021-10-21 ENCOUNTER — Other Ambulatory Visit: Payer: Self-pay

## 2021-10-21 ENCOUNTER — Ambulatory Visit (HOSPITAL_COMMUNITY): Payer: Medicare Other | Attending: Internal Medicine

## 2021-10-21 DIAGNOSIS — R9431 Abnormal electrocardiogram [ECG] [EKG]: Secondary | ICD-10-CM | POA: Insufficient documentation

## 2021-10-21 LAB — ECHOCARDIOGRAM COMPLETE
Area-P 1/2: 3.37 cm2
S' Lateral: 2.9 cm

## 2021-10-23 ENCOUNTER — Encounter: Payer: Self-pay | Admitting: Cardiology

## 2021-11-02 ENCOUNTER — Encounter: Payer: Self-pay | Admitting: Cardiology

## 2021-11-25 DIAGNOSIS — D2262 Melanocytic nevi of left upper limb, including shoulder: Secondary | ICD-10-CM | POA: Diagnosis not present

## 2021-11-25 DIAGNOSIS — D2271 Melanocytic nevi of right lower limb, including hip: Secondary | ICD-10-CM | POA: Diagnosis not present

## 2021-11-25 DIAGNOSIS — D225 Melanocytic nevi of trunk: Secondary | ICD-10-CM | POA: Diagnosis not present

## 2021-11-25 DIAGNOSIS — L821 Other seborrheic keratosis: Secondary | ICD-10-CM | POA: Diagnosis not present

## 2021-11-26 ENCOUNTER — Ambulatory Visit
Admission: RE | Admit: 2021-11-26 | Discharge: 2021-11-26 | Disposition: A | Discharge status: Home / Self Care | Payer: Self-pay | Source: Ambulatory Visit | Attending: Family Medicine | Admitting: Family Medicine

## 2021-11-26 DIAGNOSIS — Z1231 Encounter for screening mammogram for malignant neoplasm of breast: Secondary | ICD-10-CM

## 2022-02-12 ENCOUNTER — Ambulatory Visit
Admission: RE | Admit: 2022-02-12 | Discharge: 2022-02-12 | Disposition: A | Discharge status: Home / Self Care | Payer: Medicare Other | Source: Ambulatory Visit | Attending: Family Medicine | Admitting: Family Medicine

## 2022-02-12 DIAGNOSIS — M8580 Other specified disorders of bone density and structure, unspecified site: Secondary | ICD-10-CM

## 2022-02-12 DIAGNOSIS — Z780 Asymptomatic menopausal state: Secondary | ICD-10-CM | POA: Diagnosis not present

## 2022-02-12 DIAGNOSIS — M8589 Other specified disorders of bone density and structure, multiple sites: Secondary | ICD-10-CM | POA: Diagnosis not present

## 2022-02-22 DIAGNOSIS — M79604 Pain in right leg: Secondary | ICD-10-CM | POA: Diagnosis not present

## 2022-03-17 DIAGNOSIS — M9906 Segmental and somatic dysfunction of lower extremity: Secondary | ICD-10-CM | POA: Diagnosis not present

## 2022-03-17 DIAGNOSIS — M9903 Segmental and somatic dysfunction of lumbar region: Secondary | ICD-10-CM | POA: Diagnosis not present

## 2022-03-17 DIAGNOSIS — M9902 Segmental and somatic dysfunction of thoracic region: Secondary | ICD-10-CM | POA: Diagnosis not present

## 2022-03-17 DIAGNOSIS — M9905 Segmental and somatic dysfunction of pelvic region: Secondary | ICD-10-CM | POA: Diagnosis not present

## 2022-03-18 DIAGNOSIS — M9903 Segmental and somatic dysfunction of lumbar region: Secondary | ICD-10-CM | POA: Diagnosis not present

## 2022-03-18 DIAGNOSIS — M9902 Segmental and somatic dysfunction of thoracic region: Secondary | ICD-10-CM | POA: Diagnosis not present

## 2022-03-18 DIAGNOSIS — M9905 Segmental and somatic dysfunction of pelvic region: Secondary | ICD-10-CM | POA: Diagnosis not present

## 2022-03-18 DIAGNOSIS — M9906 Segmental and somatic dysfunction of lower extremity: Secondary | ICD-10-CM | POA: Diagnosis not present

## 2022-03-23 DIAGNOSIS — M9906 Segmental and somatic dysfunction of lower extremity: Secondary | ICD-10-CM | POA: Diagnosis not present

## 2022-03-23 DIAGNOSIS — M9902 Segmental and somatic dysfunction of thoracic region: Secondary | ICD-10-CM | POA: Diagnosis not present

## 2022-03-23 DIAGNOSIS — M9905 Segmental and somatic dysfunction of pelvic region: Secondary | ICD-10-CM | POA: Diagnosis not present

## 2022-03-23 DIAGNOSIS — M9903 Segmental and somatic dysfunction of lumbar region: Secondary | ICD-10-CM | POA: Diagnosis not present

## 2022-03-26 DIAGNOSIS — M9903 Segmental and somatic dysfunction of lumbar region: Secondary | ICD-10-CM | POA: Diagnosis not present

## 2022-03-26 DIAGNOSIS — M9906 Segmental and somatic dysfunction of lower extremity: Secondary | ICD-10-CM | POA: Diagnosis not present

## 2022-03-26 DIAGNOSIS — M9905 Segmental and somatic dysfunction of pelvic region: Secondary | ICD-10-CM | POA: Diagnosis not present

## 2022-03-26 DIAGNOSIS — M9902 Segmental and somatic dysfunction of thoracic region: Secondary | ICD-10-CM | POA: Diagnosis not present

## 2022-04-12 DIAGNOSIS — Z09 Encounter for follow-up examination after completed treatment for conditions other than malignant neoplasm: Secondary | ICD-10-CM | POA: Diagnosis not present

## 2022-04-12 DIAGNOSIS — D122 Benign neoplasm of ascending colon: Secondary | ICD-10-CM | POA: Diagnosis not present

## 2022-04-12 DIAGNOSIS — K5730 Diverticulosis of large intestine without perforation or abscess without bleeding: Secondary | ICD-10-CM | POA: Diagnosis not present

## 2022-04-12 DIAGNOSIS — Z86010 Personal history of colonic polyps: Secondary | ICD-10-CM | POA: Diagnosis not present

## 2022-04-12 DIAGNOSIS — K648 Other hemorrhoids: Secondary | ICD-10-CM | POA: Diagnosis not present

## 2022-04-14 DIAGNOSIS — D122 Benign neoplasm of ascending colon: Secondary | ICD-10-CM | POA: Diagnosis not present

## 2022-06-05 DIAGNOSIS — Z23 Encounter for immunization: Secondary | ICD-10-CM | POA: Diagnosis not present

## 2022-07-01 DIAGNOSIS — J019 Acute sinusitis, unspecified: Secondary | ICD-10-CM | POA: Diagnosis not present

## 2022-07-01 DIAGNOSIS — R059 Cough, unspecified: Secondary | ICD-10-CM | POA: Diagnosis not present

## 2022-07-01 DIAGNOSIS — I10 Essential (primary) hypertension: Secondary | ICD-10-CM | POA: Diagnosis not present

## 2022-09-09 DIAGNOSIS — K080 Exfoliation of teeth due to systemic causes: Secondary | ICD-10-CM | POA: Diagnosis not present

## 2022-10-20 DIAGNOSIS — K080 Exfoliation of teeth due to systemic causes: Secondary | ICD-10-CM | POA: Diagnosis not present

## 2022-10-26 ENCOUNTER — Other Ambulatory Visit: Payer: Self-pay | Admitting: Family Medicine

## 2022-10-26 DIAGNOSIS — Z1231 Encounter for screening mammogram for malignant neoplasm of breast: Secondary | ICD-10-CM

## 2022-11-25 DIAGNOSIS — D2261 Melanocytic nevi of right upper limb, including shoulder: Secondary | ICD-10-CM | POA: Diagnosis not present

## 2022-11-25 DIAGNOSIS — D224 Melanocytic nevi of scalp and neck: Secondary | ICD-10-CM | POA: Diagnosis not present

## 2022-11-25 DIAGNOSIS — D2262 Melanocytic nevi of left upper limb, including shoulder: Secondary | ICD-10-CM | POA: Diagnosis not present

## 2022-11-25 DIAGNOSIS — D225 Melanocytic nevi of trunk: Secondary | ICD-10-CM | POA: Diagnosis not present

## 2022-12-10 ENCOUNTER — Ambulatory Visit
Admission: RE | Admit: 2022-12-10 | Discharge: 2022-12-10 | Disposition: A | Discharge status: Home / Self Care | Payer: Medicare Other | Source: Ambulatory Visit | Attending: Family Medicine | Admitting: Family Medicine

## 2022-12-10 DIAGNOSIS — Z1231 Encounter for screening mammogram for malignant neoplasm of breast: Secondary | ICD-10-CM

## 2022-12-28 DIAGNOSIS — E782 Mixed hyperlipidemia: Secondary | ICD-10-CM | POA: Diagnosis not present

## 2022-12-28 DIAGNOSIS — M8580 Other specified disorders of bone density and structure, unspecified site: Secondary | ICD-10-CM | POA: Diagnosis not present

## 2022-12-28 DIAGNOSIS — I10 Essential (primary) hypertension: Secondary | ICD-10-CM | POA: Diagnosis not present

## 2022-12-28 DIAGNOSIS — Z23 Encounter for immunization: Secondary | ICD-10-CM | POA: Diagnosis not present

## 2022-12-28 DIAGNOSIS — Z0000 Encounter for general adult medical examination without abnormal findings: Secondary | ICD-10-CM | POA: Diagnosis not present

## 2022-12-28 DIAGNOSIS — M179 Osteoarthritis of knee, unspecified: Secondary | ICD-10-CM | POA: Diagnosis not present

## 2023-01-23 IMAGING — MG MM DIGITAL SCREENING BILAT W/ TOMO AND CAD
8 of 14 series · 8 of 40 positions shown · non-contrast
Comparison: Previous exam(s).

CLINICAL DATA: Screening. Technologist indicates best
positioning/images possible.

EXAM:
DIGITAL SCREENING BILATERAL MAMMOGRAM WITH TOMOSYNTHESIS AND CAD
TECHNIQUE: Bilateral screening digital craniocaudal and mediolateral oblique
mammograms were obtained. Bilateral screening digital breast
tomosynthesis was performed. The images were evaluated with
computer-aided detection.

[L MLO synth-2D (1 of 2)]
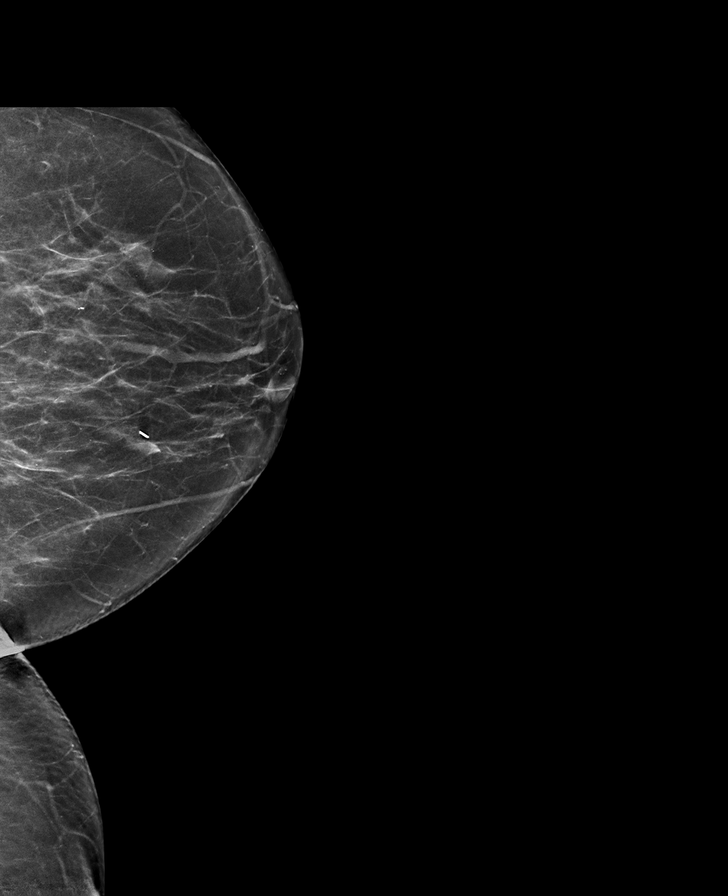

[L MLO synth-2D (2 of 2)]
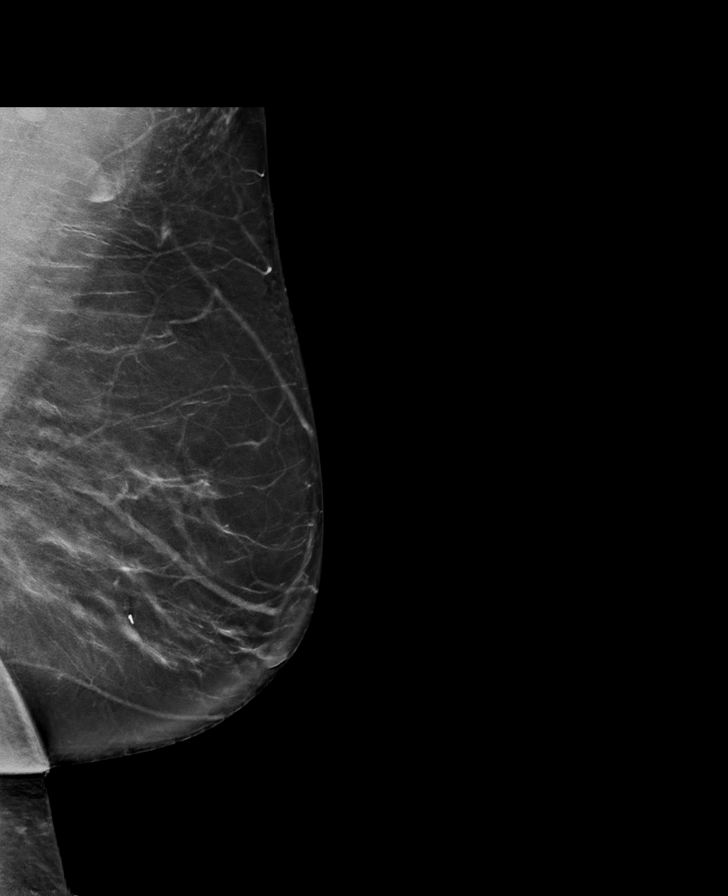

[R MLO synth-2D (1 of 2)]
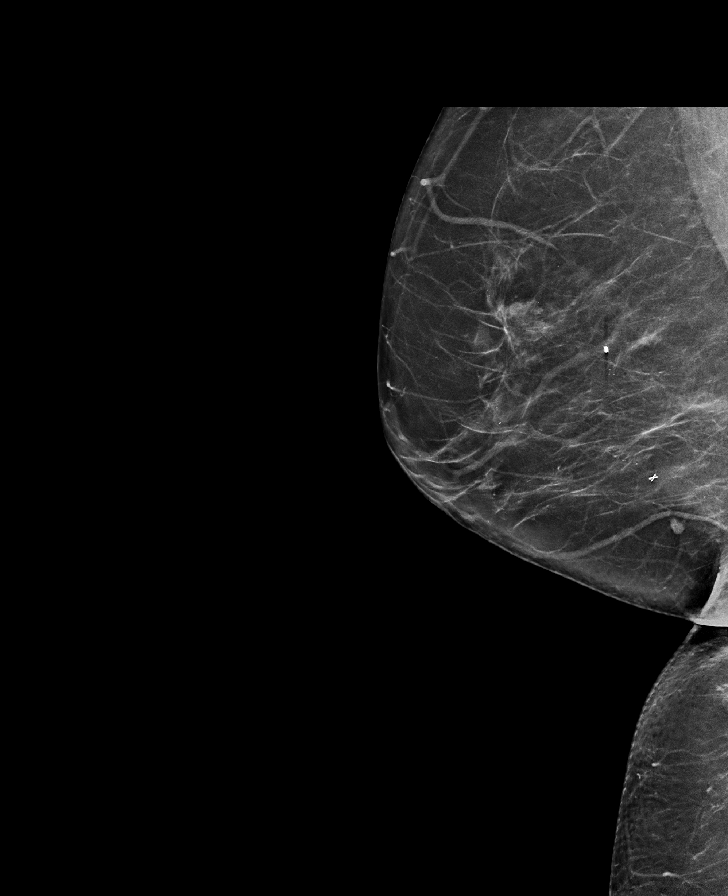

[R MLO synth-2D (2 of 2)]
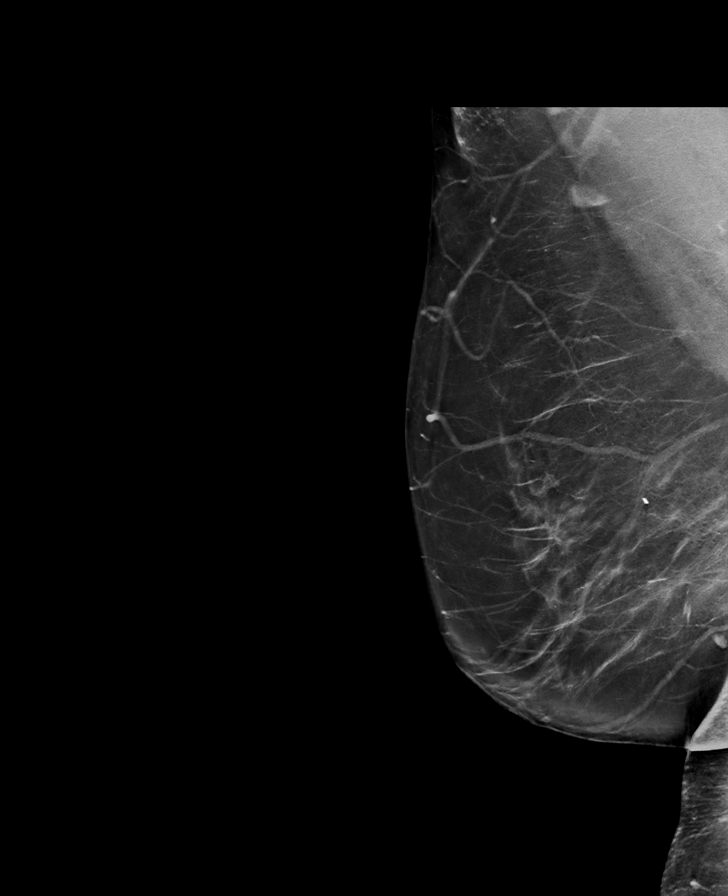

[L CC synth-2D (1 of 2)]
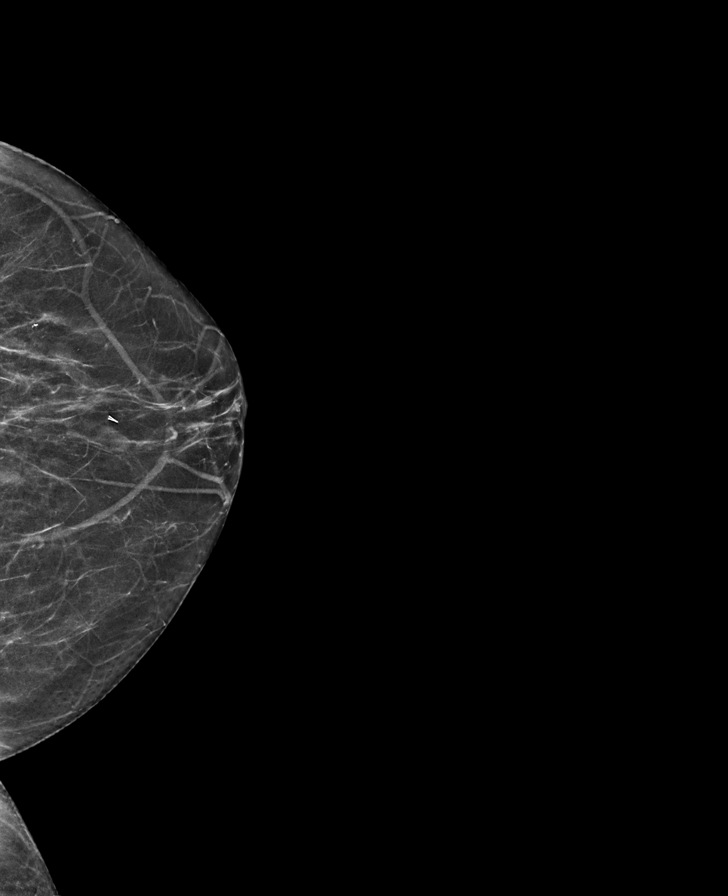

[L CC synth-2D (2 of 2)]
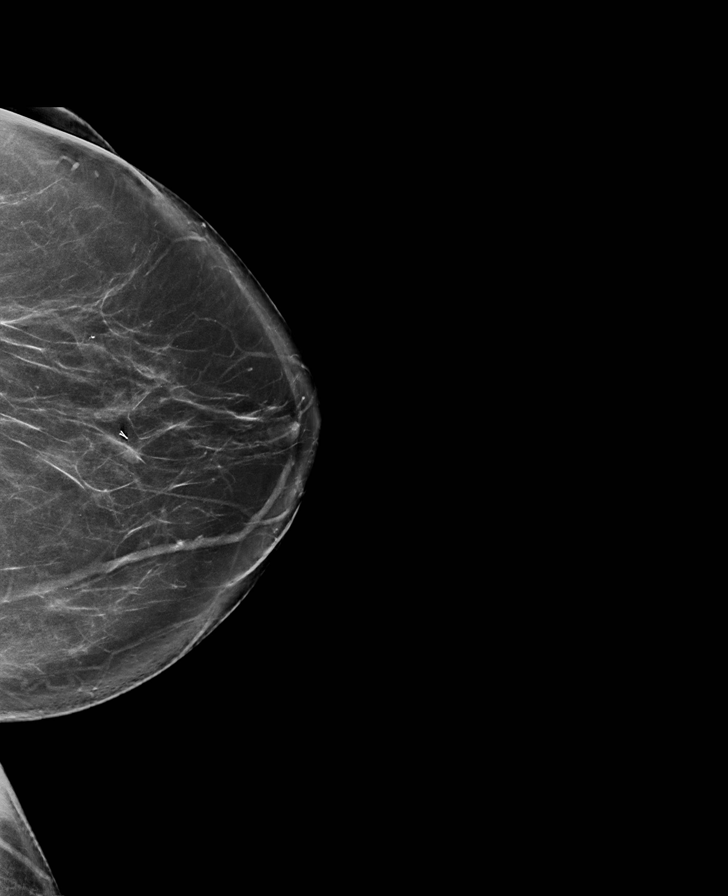

[R CC synth-2D]
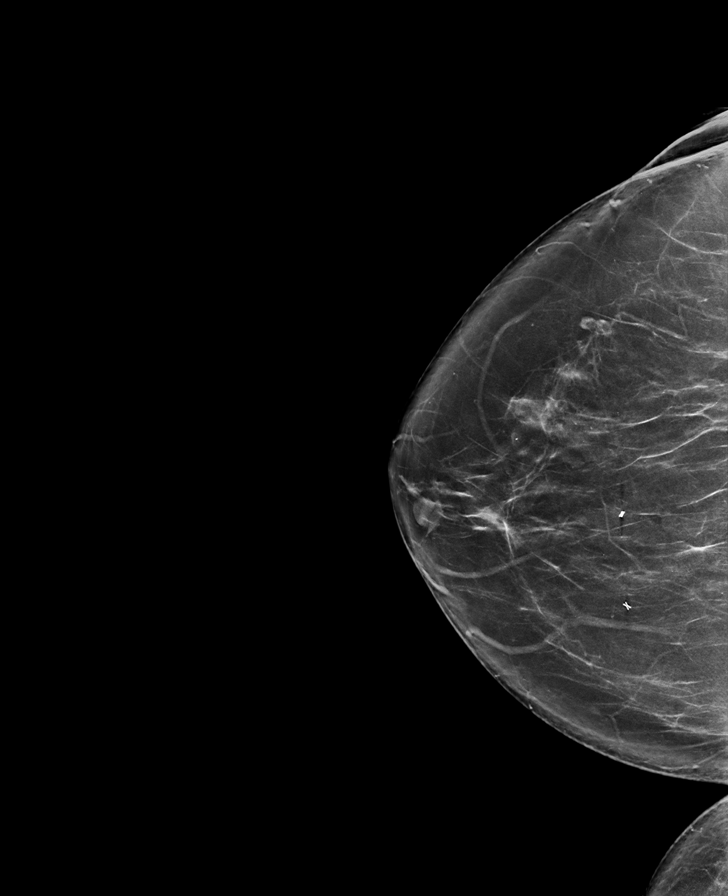

[L CC tomo · tomo slice 26/51.0]
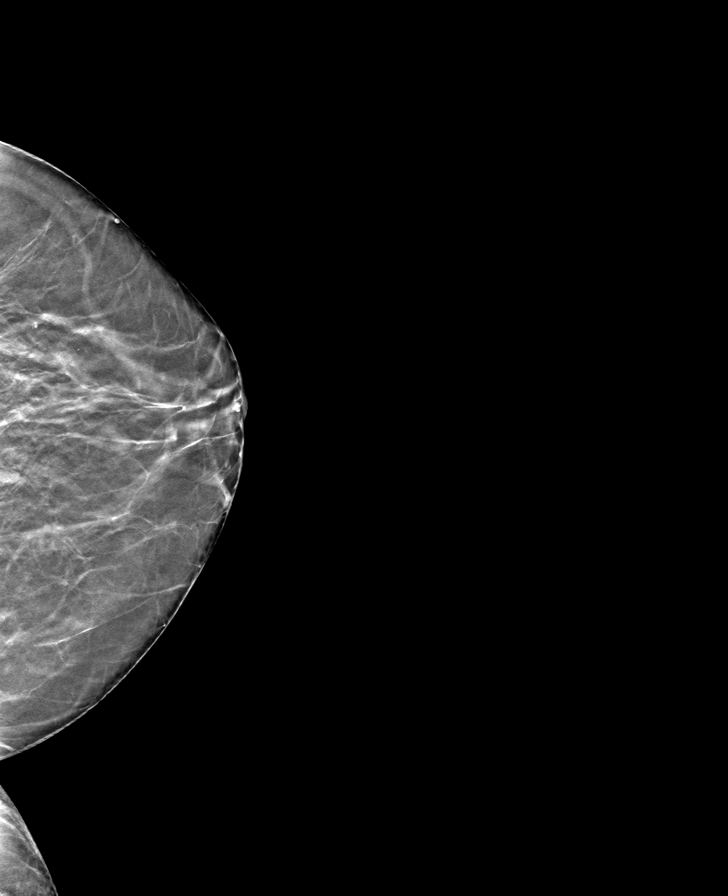

[8 of 40 positions shown; findings below may reference images not displayed]

ACR Breast Density Category b: There are scattered areas of
fibroglandular density.
FINDINGS: There are no findings suspicious for malignancy.
IMPRESSION: No mammographic evidence of malignancy. A result letter of this
screening mammogram will be mailed directly to the patient.

RECOMMENDATION:
Screening mammogram in one year. (Code:[id number]-[id number])

BI-RADS CATEGORY  1: Negative.

## 2023-04-25 DIAGNOSIS — M182 Bilateral post-traumatic osteoarthritis of first carpometacarpal joints: Secondary | ICD-10-CM | POA: Diagnosis not present

## 2023-06-04 DIAGNOSIS — Z23 Encounter for immunization: Secondary | ICD-10-CM | POA: Diagnosis not present

## 2023-06-09 DIAGNOSIS — K080 Exfoliation of teeth due to systemic causes: Secondary | ICD-10-CM | POA: Diagnosis not present

## 2023-07-24 DIAGNOSIS — Z03818 Encounter for observation for suspected exposure to other biological agents ruled out: Secondary | ICD-10-CM | POA: Diagnosis not present

## 2023-07-24 DIAGNOSIS — R509 Fever, unspecified: Secondary | ICD-10-CM | POA: Diagnosis not present

## 2023-07-24 DIAGNOSIS — J22 Unspecified acute lower respiratory infection: Secondary | ICD-10-CM | POA: Diagnosis not present

## 2023-09-13 DIAGNOSIS — H2513 Age-related nuclear cataract, bilateral: Secondary | ICD-10-CM | POA: Diagnosis not present

## 2023-11-08 ENCOUNTER — Other Ambulatory Visit: Payer: Self-pay | Admitting: Family Medicine

## 2023-11-08 DIAGNOSIS — Z0000 Encounter for general adult medical examination without abnormal findings: Secondary | ICD-10-CM

## 2023-12-08 DIAGNOSIS — K080 Exfoliation of teeth due to systemic causes: Secondary | ICD-10-CM | POA: Diagnosis not present

## 2023-12-12 ENCOUNTER — Ambulatory Visit
Admission: RE | Admit: 2023-12-12 | Discharge: 2023-12-12 | Disposition: A | Discharge status: Home / Self Care | Source: Ambulatory Visit | Attending: Family Medicine | Admitting: Family Medicine

## 2023-12-12 DIAGNOSIS — Z1231 Encounter for screening mammogram for malignant neoplasm of breast: Secondary | ICD-10-CM | POA: Diagnosis not present

## 2023-12-12 DIAGNOSIS — Z0000 Encounter for general adult medical examination without abnormal findings: Secondary | ICD-10-CM

## 2023-12-29 DIAGNOSIS — Z131 Encounter for screening for diabetes mellitus: Secondary | ICD-10-CM | POA: Diagnosis not present

## 2023-12-29 DIAGNOSIS — M179 Osteoarthritis of knee, unspecified: Secondary | ICD-10-CM | POA: Diagnosis not present

## 2023-12-29 DIAGNOSIS — I10 Essential (primary) hypertension: Secondary | ICD-10-CM | POA: Diagnosis not present

## 2023-12-29 DIAGNOSIS — Z0000 Encounter for general adult medical examination without abnormal findings: Secondary | ICD-10-CM | POA: Diagnosis not present

## 2023-12-29 DIAGNOSIS — E782 Mixed hyperlipidemia: Secondary | ICD-10-CM | POA: Diagnosis not present

## 2023-12-29 DIAGNOSIS — Z1331 Encounter for screening for depression: Secondary | ICD-10-CM | POA: Diagnosis not present

## 2023-12-29 DIAGNOSIS — M8580 Other specified disorders of bone density and structure, unspecified site: Secondary | ICD-10-CM | POA: Diagnosis not present

## 2024-01-02 ENCOUNTER — Other Ambulatory Visit (HOSPITAL_BASED_OUTPATIENT_CLINIC_OR_DEPARTMENT_OTHER): Payer: Self-pay | Admitting: Family Medicine

## 2024-01-02 DIAGNOSIS — M8580 Other specified disorders of bone density and structure, unspecified site: Secondary | ICD-10-CM

## 2024-01-10 DIAGNOSIS — M25551 Pain in right hip: Secondary | ICD-10-CM | POA: Diagnosis not present

## 2024-01-10 DIAGNOSIS — M25552 Pain in left hip: Secondary | ICD-10-CM | POA: Diagnosis not present

## 2024-01-16 DIAGNOSIS — K080 Exfoliation of teeth due to systemic causes: Secondary | ICD-10-CM | POA: Diagnosis not present

## 2024-01-18 DIAGNOSIS — M25551 Pain in right hip: Secondary | ICD-10-CM | POA: Diagnosis not present

## 2024-01-18 DIAGNOSIS — M25552 Pain in left hip: Secondary | ICD-10-CM | POA: Diagnosis not present

## 2024-01-24 DIAGNOSIS — D2271 Melanocytic nevi of right lower limb, including hip: Secondary | ICD-10-CM | POA: Diagnosis not present

## 2024-01-24 DIAGNOSIS — L821 Other seborrheic keratosis: Secondary | ICD-10-CM | POA: Diagnosis not present

## 2024-01-24 DIAGNOSIS — L814 Other melanin hyperpigmentation: Secondary | ICD-10-CM | POA: Diagnosis not present

## 2024-01-24 DIAGNOSIS — D225 Melanocytic nevi of trunk: Secondary | ICD-10-CM | POA: Diagnosis not present

## 2024-02-16 ENCOUNTER — Inpatient Hospital Stay
Admission: EM | Admit: 2024-02-16 | Discharge: 2024-02-20 | DRG: 281 | Disposition: A | Discharge status: Home / Self Care | Attending: Internal Medicine | Admitting: Internal Medicine

## 2024-02-16 ENCOUNTER — Other Ambulatory Visit: Payer: Self-pay

## 2024-02-16 ENCOUNTER — Emergency Department

## 2024-02-16 ENCOUNTER — Encounter: Payer: Self-pay | Admitting: Emergency Medicine

## 2024-02-16 DIAGNOSIS — D72829 Elevated white blood cell count, unspecified: Secondary | ICD-10-CM | POA: Diagnosis not present

## 2024-02-16 DIAGNOSIS — I4719 Other supraventricular tachycardia: Secondary | ICD-10-CM | POA: Diagnosis present

## 2024-02-16 DIAGNOSIS — Z881 Allergy status to other antibiotic agents status: Secondary | ICD-10-CM | POA: Diagnosis not present

## 2024-02-16 DIAGNOSIS — Z79899 Other long term (current) drug therapy: Secondary | ICD-10-CM

## 2024-02-16 DIAGNOSIS — Z634 Disappearance and death of family member: Secondary | ICD-10-CM

## 2024-02-16 DIAGNOSIS — Z7982 Long term (current) use of aspirin: Secondary | ICD-10-CM | POA: Diagnosis not present

## 2024-02-16 DIAGNOSIS — Z87891 Personal history of nicotine dependence: Secondary | ICD-10-CM | POA: Diagnosis not present

## 2024-02-16 DIAGNOSIS — R9431 Abnormal electrocardiogram [ECG] [EKG]: Secondary | ICD-10-CM | POA: Diagnosis not present

## 2024-02-16 DIAGNOSIS — E876 Hypokalemia: Secondary | ICD-10-CM | POA: Diagnosis not present

## 2024-02-16 DIAGNOSIS — I5181 Takotsubo syndrome: Secondary | ICD-10-CM | POA: Diagnosis not present

## 2024-02-16 DIAGNOSIS — Z880 Allergy status to penicillin: Secondary | ICD-10-CM | POA: Diagnosis not present

## 2024-02-16 DIAGNOSIS — Z90710 Acquired absence of both cervix and uterus: Secondary | ICD-10-CM | POA: Diagnosis not present

## 2024-02-16 DIAGNOSIS — E785 Hyperlipidemia, unspecified: Secondary | ICD-10-CM | POA: Diagnosis not present

## 2024-02-16 DIAGNOSIS — I4891 Unspecified atrial fibrillation: Secondary | ICD-10-CM | POA: Diagnosis not present

## 2024-02-16 DIAGNOSIS — Z888 Allergy status to other drugs, medicaments and biological substances status: Secondary | ICD-10-CM | POA: Diagnosis not present

## 2024-02-16 DIAGNOSIS — I5021 Acute systolic (congestive) heart failure: Secondary | ICD-10-CM | POA: Diagnosis not present

## 2024-02-16 DIAGNOSIS — I493 Ventricular premature depolarization: Secondary | ICD-10-CM | POA: Diagnosis present

## 2024-02-16 DIAGNOSIS — I471 Supraventricular tachycardia, unspecified: Secondary | ICD-10-CM | POA: Diagnosis not present

## 2024-02-16 DIAGNOSIS — I472 Ventricular tachycardia, unspecified: Secondary | ICD-10-CM | POA: Diagnosis not present

## 2024-02-16 DIAGNOSIS — I214 Non-ST elevation (NSTEMI) myocardial infarction: Principal | ICD-10-CM | POA: Diagnosis present

## 2024-02-16 DIAGNOSIS — Z7901 Long term (current) use of anticoagulants: Secondary | ICD-10-CM

## 2024-02-16 DIAGNOSIS — Z8249 Family history of ischemic heart disease and other diseases of the circulatory system: Secondary | ICD-10-CM

## 2024-02-16 DIAGNOSIS — I10 Essential (primary) hypertension: Secondary | ICD-10-CM | POA: Diagnosis present

## 2024-02-16 DIAGNOSIS — E871 Hypo-osmolality and hyponatremia: Secondary | ICD-10-CM | POA: Diagnosis not present

## 2024-02-16 DIAGNOSIS — R002 Palpitations: Secondary | ICD-10-CM | POA: Diagnosis not present

## 2024-02-16 DIAGNOSIS — R0789 Other chest pain: Secondary | ICD-10-CM | POA: Diagnosis not present

## 2024-02-16 DIAGNOSIS — M8580 Other specified disorders of bone density and structure, unspecified site: Secondary | ICD-10-CM | POA: Diagnosis present

## 2024-02-16 DIAGNOSIS — R079 Chest pain, unspecified: Secondary | ICD-10-CM | POA: Diagnosis not present

## 2024-02-16 DIAGNOSIS — I4581 Long QT syndrome: Secondary | ICD-10-CM | POA: Diagnosis not present

## 2024-02-16 LAB — BASIC METABOLIC PANEL WITH GFR
Anion gap: 12 (ref 5–15)
BUN: 13 mg/dL (ref 8–23)
CO2: 24 mmol/L (ref 22–32)
Calcium: 8.9 mg/dL (ref 8.9–10.3)
Chloride: 95 mmol/L — ABNORMAL LOW (ref 98–111)
Creatinine, Ser: 0.59 mg/dL (ref 0.44–1.00)
GFR, Estimated: >60 mL/min (ref >60)
Glucose, Bld: 132 mg/dL — ABNORMAL HIGH (ref 70–99)
Potassium: 3.8 mmol/L (ref 3.5–5.1)
Sodium: 131 mmol/L — ABNORMAL LOW (ref 135–145)

## 2024-02-16 LAB — CBC
HCT: 39.9 % (ref 36.0–46.0)
Hemoglobin: 13.8 g/dL (ref 12.0–15.0)
MCH: 30.5 pg (ref 26.0–34.0)
MCHC: 34.6 g/dL (ref 30.0–36.0)
MCV: 88.1 fL (ref 80.0–100.0)
Platelets: 288 K/uL (ref 150–400)
RBC: 4.53 MIL/uL (ref 3.87–5.11)
RDW: 12.4 % (ref 11.5–15.5)
WBC: 19.6 K/uL — ABNORMAL HIGH (ref 4.0–10.5)
nRBC: 0 % (ref 0.0–0.2)

## 2024-02-16 LAB — APTT: aPTT: 30 s (ref 24–36)

## 2024-02-16 LAB — TROPONIN I (HIGH SENSITIVITY): Troponin I (High Sensitivity): 1187 ng/L (ref <18)

## 2024-02-16 MED ORDER — ASPIRIN 81 MG PO TBEC: 81.0000 mg | DELAYED_RELEASE_TABLET | Freq: Every day | ORAL | Status: DC

## 2024-02-16 MED ORDER — HEPARIN BOLUS VIA INFUSION
4000.0000 [IU] | Freq: Once | INTRAVENOUS | Status: AC
  Administered 2024-02-16: 4000 [IU] via INTRAVENOUS
  Filled 2024-02-16: qty 4000

## 2024-02-16 MED ORDER — HEPARIN (PORCINE) 25000 UT/250ML-% IV SOLN
1100.0000 [IU]/h | INTRAVENOUS | Status: DC
  Administered 2024-02-16: 800 [IU]/h via INTRAVENOUS
  Filled 2024-02-16: qty 250

## 2024-02-16 MED ORDER — LISINOPRIL-HYDROCHLOROTHIAZIDE 10-12.5 MG PO TABS: 1.0000 | ORAL_TABLET | ORAL | Status: DC

## 2024-02-16 MED ORDER — NITROGLYCERIN 0.4 MG SL SUBL
0.4000 mg | SUBLINGUAL_TABLET | SUBLINGUAL | Status: DC | PRN
  Administered 2024-02-16 (×2): 0.4 mg via SUBLINGUAL
  Filled 2024-02-16 (×2): qty 1

## 2024-02-16 MED ORDER — ASPIRIN 81 MG PO CHEW
324.0000 mg | CHEWABLE_TABLET | Freq: Once | ORAL | Status: AC
  Administered 2024-02-16: 324 mg via ORAL
  Filled 2024-02-16: qty 4

## 2024-02-16 MED ORDER — ASPIRIN 81 MG PO TBEC
81.0000 mg | DELAYED_RELEASE_TABLET | Freq: Every day | ORAL | Status: DC
  Administered 2024-02-17 – 2024-02-20 (×4): 81 mg via ORAL
  Filled 2024-02-16 (×4): qty 1

## 2024-02-16 MED ORDER — ONDANSETRON HCL 4 MG/2ML IJ SOLN: 4.0000 mg | Freq: Four times a day (QID) | INTRAMUSCULAR | Status: DC | PRN

## 2024-02-16 MED ORDER — ACETAMINOPHEN 325 MG PO TABS
650.0000 mg | ORAL_TABLET | ORAL | Status: DC | PRN
  Administered 2024-02-17: 650 mg via ORAL
  Filled 2024-02-16: qty 2

## 2024-02-16 MED ORDER — ROSUVASTATIN CALCIUM 10 MG PO TABS
40.0000 mg | ORAL_TABLET | Freq: Every day | ORAL | Status: DC
  Administered 2024-02-17: 40 mg via ORAL
  Filled 2024-02-16: qty 4

## 2024-02-16 MED ORDER — ASPIRIN 81 MG PO CHEW: 324.0000 mg | CHEWABLE_TABLET | ORAL | Status: DC

## 2024-02-16 MED ORDER — ACETAMINOPHEN 500 MG PO TABS: 1000.0000 mg | ORAL_TABLET | Freq: Three times a day (TID) | ORAL | Status: DC | PRN

## 2024-02-16 MED ORDER — ASPIRIN 300 MG RE SUPP: 300.0000 mg | RECTAL | Status: DC

## 2024-02-16 MED ORDER — NITROGLYCERIN 0.4 MG SL SUBL: 0.4000 mg | SUBLINGUAL_TABLET | SUBLINGUAL | Status: DC | PRN

## 2024-02-16 NOTE — ED Triage Notes (Signed)
 Pt presents to the ED via POV with complaints of CP that started last night after some social stressors. Pain is located across bilateral shoulders and at the top of her chest. She notes taking Tylenol  PTA for some generalized body aches with minimal improvement. A&Ox4 at this time. Denies SOB.

## 2024-02-16 NOTE — Progress Notes (Signed)
 PHARMACY - ANTICOAGULATION CONSULT NOTE  Pharmacy Consult for Heparin   Indication: chest pain/ACS  Allergies  Allergen Reactions   Alendronate Other (See Comments)   Penicillins Hives and Itching   Amoxicillin Rash   Amoxicillin-Pot Clavulanate Rash    Patient Measurements: Height: 5' 3 (160 cm) Weight: 68 kg (150 lb) IBW/kg (Calculated) : 52.4 HEPARIN  DW (KG): 66.3  Vital Signs: Temp: 98 F (36.7 C) (07/17 2221) Temp Source: Oral (07/17 2221) BP: 127/72 (07/17 2221) Pulse Rate: 93 (07/17 2221)  Labs: Recent Labs    02/16/24 2136  HGB 13.8  HCT 39.9  PLT 288  CREATININE 0.59  TROPONINIHS 1,187*    Estimated Creatinine Clearance: 63.1 mL/min (by C-G formula based on SCr of 0.59 mg/dL).   Medical History: Past Medical History:  Diagnosis Date   Hematuria    Hemorrhoids    Hyperlipidemia    Hypertension    Insomnia    Menopausal symptoms    Osteopenia     Medications:  (Not in a hospital admission)   Assessment: Pharmacy consulted to dose heparin  in this 68 year old female admitted with ACS/NSTEMI.  No prior anticoag noted. CrCl = 63.1 ml/min  Goal of Therapy:  Heparin  level 0.3-0.7 units/ml Monitor platelets by anticoagulation protocol: Yes   Plan:  Give 4000 units bolus x 1 - start heparin  drip at 800 units/hr - check HL 6 hrs after start of drip - CBC daily   Arthi Mcdonald D 02/16/2024,10:58 PM

## 2024-02-16 NOTE — ED Provider Notes (Addendum)
 [facility] Provider Note    Event Date/Time   First MD Initiated Contact with Patient 02/16/24 2223     (approximate)   History   Chief Complaint: Chest Pain   HPI  Laura Cohen is a 68 y.o. female with a history of heart retention, hyperlipidemia who was in her usual state of health until last night when she started experiencing chest pain at the center of her chest, feels like tightness and pressure.  No shortness of breath or radiation, not exertional.  Constant since yesterday.  Pain onset was immediately after checking on her elderly mother and discovering that she had died at home        Past Medical History:  Diagnosis Date   Hematuria    Hemorrhoids    Hyperlipidemia    Hypertension    Insomnia    Menopausal symptoms    Osteopenia     Current Outpatient Rx   Order #: [id number] Class: Historical Med   Order #: [id number] Class: Historical Med   Order #: [id number] Class: Historical Med   Order #: [id number] Class: Historical Med   Order #: [id number] Class: Historical Med   Order #: [id number] Class: Historical Med   Order #: [id number] Class: Historical Med   Order #: [id number] Class: Historical Med   Order #: [id number] Class: Historical Med   Order #: [id number] Class: Historical Med   Order #: [id number] Class: Historical Med    Past Surgical History:  Procedure Laterality Date   ABDOMINAL HYSTERECTOMY     BREAST EXCISIONAL BIOPSY     BREAST SURGERY      Physical Exam   Triage Vital Signs: ED Triage Vitals  Encounter Vitals Group     BP 02/16/24 2221 127/72     Girls Systolic BP Percentile --      Girls Diastolic BP Percentile --      Boys Systolic BP Percentile --      Boys Diastolic BP Percentile --      Pulse Rate 02/16/24 2221 93     Resp 02/16/24 2221 19     Temp 02/16/24 2221 98 F (36.7 C)     Temp Source 02/16/24 2221 Oral     SpO2 02/16/24 2221 100 %     Weight 02/16/24 2133 150 lb (68 kg)     Height 02/16/24 2133  5' 3 (1.6 m)     Head Circumference --      Peak Flow --      Pain Score 02/16/24 2133 3     Pain Loc --      Pain Education --      Exclude from Growth Chart --     Most recent vital signs: Vitals:   02/16/24 2221 02/16/24 2221  BP:  127/72  Pulse:  93  Resp: 19 18  Temp:  98 F (36.7 C)  SpO2:  100%    General: Awake, no distress.  CV:  Good peripheral perfusion.  Regular rate rhythm, no murmurs Resp:  Normal effort.  Clear to auscultation bilaterally Abd:  No distention.  Soft nontender Other:  No lower extremity edema   ED Results / Procedures / Treatments   Labs (all labs ordered are listed, but only abnormal results are displayed) Labs Reviewed  BASIC METABOLIC PANEL WITH GFR - Abnormal; Notable for the following components:      Result Value   Sodium 131 (*)    Chloride 95 (*)    Glucose, Bld 132 (*)    All other components  within normal limits  CBC - Abnormal; Notable for the following components:   WBC 19.6 (*)    All other components within normal limits  TROPONIN I (HIGH SENSITIVITY) - Abnormal; Notable for the following components:   Troponin I (High Sensitivity) 1,187 (*)    All other components within normal limits  APTT  HEPARIN  LEVEL (UNFRACTIONATED)  CBC  TROPONIN I (HIGH SENSITIVITY)     EKG Interpreted by me Sinus rhythm rate of 97.  Normal axis and intervals.  Poor R wave progression.  Normal ST segments.  Inferolateral T waves which is new compared to previous.  EKG with right sided lead negative for right heart STEMI.   RADIOLOGY Chest x-ray interpreted by me, unremarkable.  Radiology report reviewed   PROCEDURES:  .Critical Care  Performed by: Viviann Pastor, MD Authorized by: Viviann Pastor, MD   Critical care provider statement:    Critical care time (minutes):  35   Critical care time was exclusive of:  Separately billable procedures and treating other patients   Critical care was necessary to treat or prevent  imminent or life-threatening deterioration of the following conditions:  Cardiac failure   Critical care was time spent personally by me on the following activities:  Development of treatment plan with patient or surrogate, discussions with consultants, evaluation of patient's response to treatment, examination of patient, obtaining history from patient or surrogate, ordering and performing treatments and interventions, ordering and review of laboratory studies, ordering and review of radiographic studies, pulse oximetry, re-evaluation of patient's condition and review of old charts   Care discussed with: admitting provider      MEDICATIONS ORDERED IN ED: Medications  nitroGLYCERIN  (NITROSTAT ) SL tablet 0.4 mg (0.4 mg Sublingual Given 02/16/24 2243)  heparin  ADULT infusion 100 units/mL (25000 units/250mL) (800 Units/hr Intravenous New Bag/Given 02/16/24 2306)  aspirin  chewable tablet 324 mg (324 mg Oral Given 02/16/24 2243)  heparin  bolus via infusion 4,000 Units (4,000 Units Intravenous Bolus from Bag 02/16/24 2306)     IMPRESSION / MDM / ASSESSMENT AND PLAN / ED COURSE  I reviewed the triage vital signs and the nursing notes.  DDx: Pneumothorax, anxiety, GERD, non-STEMI  Patient's presentation is most consistent with acute presentation with potential threat to life or bodily function.  Patient presents with chest pain starting suddenly after shocking event of discovering that her mother had died.  She has EKG changes with inferolateral T wave inversions.  Initial troponin elevated at 1100.  Recommend hospitalization tonight for telemetry monitoring, troponin trending, aspirin  nitro heparin .  Patient agreeable, though she is reluctant to stay in the hospital into the weekend due to urgent family needs related to her mother's death.   ----------------------------------------- 11:47 PM on 02/16/2024 ----------------------------------------- Case discussed with hospitalist      FINAL  CLINICAL IMPRESSION(S) / ED DIAGNOSES   Final diagnoses:  NSTEMI (non-ST elevated myocardial infarction) (HCC)     Rx / DC Orders   ED Discharge Orders     None        Note:  This document was prepared using Dragon voice recognition software and may include unintentional dictation errors.   Viviann Pastor, MD 02/16/24 2313    Viviann Pastor, MD 02/16/24 [phone]

## 2024-02-17 ENCOUNTER — Inpatient Hospital Stay (HOSPITAL_COMMUNITY)
Admit: 2024-02-17 | Discharge: 2024-02-17 | Disposition: A | Discharge status: Home / Self Care | Attending: Internal Medicine | Admitting: Internal Medicine

## 2024-02-17 ENCOUNTER — Encounter: Payer: Self-pay | Admitting: Internal Medicine

## 2024-02-17 ENCOUNTER — Other Ambulatory Visit: Payer: Self-pay

## 2024-02-17 ENCOUNTER — Encounter
Admission: EM | Disposition: A | Discharge status: Home / Self Care | Payer: Self-pay | Source: Home / Self Care | Attending: Student

## 2024-02-17 DIAGNOSIS — I214 Non-ST elevation (NSTEMI) myocardial infarction: Secondary | ICD-10-CM

## 2024-02-17 DIAGNOSIS — I4891 Unspecified atrial fibrillation: Secondary | ICD-10-CM

## 2024-02-17 DIAGNOSIS — I5181 Takotsubo syndrome: Secondary | ICD-10-CM

## 2024-02-17 HISTORY — PX: LEFT HEART CATH AND CORONARY ANGIOGRAPHY: CATH118249

## 2024-02-17 LAB — LIPID PANEL
Cholesterol: 116 mg/dL (ref 0–200)
HDL: 60 mg/dL (ref >40)
LDL Cholesterol: 35 mg/dL (ref 0–99)
Total CHOL/HDL Ratio: 1.9 ratio
Triglycerides: 107 mg/dL (ref <150)
VLDL: 21 mg/dL (ref 0–40)

## 2024-02-17 LAB — ECHOCARDIOGRAM COMPLETE
AR max vel: 2.24 cm2
AV Area VTI: 2.41 cm2
AV Area mean vel: 2.21 cm2
AV Mean grad: 3 mmHg
AV Peak grad: 4.6 mmHg
Ao pk vel: 1.07 m/s
Area-P 1/2: 4.31 cm2
Calc EF: 44.2 %
Height: 63 in
MV VTI: 1.75 cm2
S' Lateral: 2.7 cm
Single Plane A2C EF: 42.6 %
Single Plane A4C EF: 45.5 %
Weight: 2454.4 [oz_av]

## 2024-02-17 LAB — MAGNESIUM
Magnesium: 1.8 mg/dL (ref 1.7–2.4)
Magnesium: 1.7 mg/dL (ref 1.7–2.4)

## 2024-02-17 LAB — CBC
HCT: 37 % (ref 36.0–46.0)
Hemoglobin: 12.8 g/dL (ref 12.0–15.0)
MCH: 30.1 pg (ref 26.0–34.0)
MCHC: 34.6 g/dL (ref 30.0–36.0)
MCV: 87.1 fL (ref 80.0–100.0)
Platelets: 252 K/uL (ref 150–400)
RBC: 4.25 MIL/uL (ref 3.87–5.11)
RDW: 12.3 % (ref 11.5–15.5)
WBC: 15.6 K/uL — ABNORMAL HIGH (ref 4.0–10.5)
nRBC: 0 % (ref 0.0–0.2)

## 2024-02-17 LAB — BASIC METABOLIC PANEL WITH GFR
Anion gap: 9 (ref 5–15)
Anion gap: 13 (ref 5–15)
BUN: 11 mg/dL (ref 8–23)
BUN: 7 mg/dL — ABNORMAL LOW (ref 8–23)
CO2: 23 mmol/L (ref 22–32)
CO2: 21 mmol/L — ABNORMAL LOW (ref 22–32)
Calcium: 8.4 mg/dL — ABNORMAL LOW (ref 8.9–10.3)
Calcium: 8.4 mg/dL — ABNORMAL LOW (ref 8.9–10.3)
Chloride: 98 mmol/L (ref 98–111)
Chloride: 98 mmol/L (ref 98–111)
Creatinine, Ser: 0.48 mg/dL (ref 0.44–1.00)
Creatinine, Ser: 0.63 mg/dL (ref 0.44–1.00)
GFR, Estimated: >60 mL/min (ref >60)
GFR, Estimated: >60 mL/min (ref >60)
Glucose, Bld: 122 mg/dL — ABNORMAL HIGH (ref 70–99)
Glucose, Bld: 175 mg/dL — ABNORMAL HIGH (ref 70–99)
Potassium: 3.6 mmol/L (ref 3.5–5.1)
Potassium: 3.2 mmol/L — ABNORMAL LOW (ref 3.5–5.1)
Sodium: 130 mmol/L — ABNORMAL LOW (ref 135–145)
Sodium: 132 mmol/L — ABNORMAL LOW (ref 135–145)

## 2024-02-17 LAB — BRAIN NATRIURETIC PEPTIDE: B Natriuretic Peptide: 870.3 pg/mL — ABNORMAL HIGH (ref 0.0–100.0)

## 2024-02-17 LAB — VITAMIN B12: Vitamin B-12: 458 pg/mL (ref 180–914)

## 2024-02-17 LAB — HEPARIN LEVEL (UNFRACTIONATED)
Heparin Unfractionated: 0.29 [IU]/mL — ABNORMAL LOW (ref 0.30–0.70)
Heparin Unfractionated: 0.19 [IU]/mL — ABNORMAL LOW (ref 0.30–0.70)

## 2024-02-17 LAB — TROPONIN I (HIGH SENSITIVITY): Troponin I (High Sensitivity): 911 ng/L (ref <18)

## 2024-02-17 LAB — D-DIMER, QUANTITATIVE: D-Dimer, Quant: <0.27 ug{FEU}/mL (ref 0.00–0.50)

## 2024-02-17 LAB — HIV ANTIBODY (ROUTINE TESTING W REFLEX): HIV Screen 4th Generation wRfx: NONREACTIVE

## 2024-02-17 LAB — PHOSPHORUS: Phosphorus: 2.6 mg/dL (ref 2.5–4.6)

## 2024-02-17 LAB — OSMOLALITY: Osmolality: 273 mosm/kg — ABNORMAL LOW (ref 275–295)

## 2024-02-17 LAB — TSH: TSH: 1.998 u[IU]/mL (ref 0.350–4.500)

## 2024-02-17 LAB — VITAMIN D 25 HYDROXY (VIT D DEFICIENCY, FRACTURES): Vit D, 25-Hydroxy: 54.31 ng/mL (ref 30–100)

## 2024-02-17 LAB — GLUCOSE, CAPILLARY: Glucose-Capillary: 150 mg/dL — ABNORMAL HIGH (ref 70–99)

## 2024-02-17 SURGERY — LEFT HEART CATH AND CORONARY ANGIOGRAPHY
Anesthesia: Moderate Sedation

## 2024-02-17 MED ORDER — SODIUM CHLORIDE 0.9 % IV SOLN: 250.0000 mL | INTRAVENOUS | Status: AC | PRN

## 2024-02-17 MED ORDER — ASPIRIN 81 MG PO CHEW: 81.0000 mg | CHEWABLE_TABLET | ORAL | Status: DC

## 2024-02-17 MED ORDER — HEPARIN (PORCINE) IN NACL 1000-0.9 UT/500ML-% IV SOLN
INTRAVENOUS | Status: DC | PRN
  Administered 2024-02-17: 1000 mL

## 2024-02-17 MED ORDER — IOHEXOL 300 MG/ML  SOLN
INTRAMUSCULAR | Status: DC | PRN
  Administered 2024-02-17: 21 mL

## 2024-02-17 MED ORDER — LISINOPRIL 10 MG PO TABS: 10.0000 mg | ORAL_TABLET | Freq: Every day | ORAL | Status: DC

## 2024-02-17 MED ORDER — HYDRALAZINE HCL 20 MG/ML IJ SOLN: 10.0000 mg | INTRAMUSCULAR | Status: AC | PRN

## 2024-02-17 MED ORDER — PERFLUTREN LIPID MICROSPHERE
1.0000 mL | INTRAVENOUS | Status: AC | PRN
  Administered 2024-02-17: 2 mL via INTRAVENOUS

## 2024-02-17 MED ORDER — VERAPAMIL HCL 2.5 MG/ML IV SOLN
INTRAVENOUS | Status: AC
  Filled 2024-02-17: qty 2

## 2024-02-17 MED ORDER — HEPARIN (PORCINE) 25000 UT/250ML-% IV SOLN: 1100.0000 [IU]/h | INTRAVENOUS | Status: DC

## 2024-02-17 MED ORDER — SODIUM CHLORIDE 0.9 % WEIGHT BASED INFUSION: 1.0000 mL/kg/h | INTRAVENOUS | Status: DC

## 2024-02-17 MED ORDER — HEPARIN SODIUM (PORCINE) 1000 UNIT/ML IJ SOLN
INTRAMUSCULAR | Status: DC | PRN
  Administered 2024-02-17: 3500 [IU] via INTRAVENOUS

## 2024-02-17 MED ORDER — HEPARIN BOLUS VIA INFUSION
1000.0000 [IU] | Freq: Once | INTRAVENOUS | Status: AC
  Administered 2024-02-17: 1000 [IU] via INTRAVENOUS
  Filled 2024-02-17: qty 1000

## 2024-02-17 MED ORDER — MIDAZOLAM HCL 2 MG/2ML IJ SOLN
INTRAMUSCULAR | Status: AC
  Filled 2024-02-17: qty 2

## 2024-02-17 MED ORDER — VERAPAMIL HCL 2.5 MG/ML IV SOLN
INTRAVENOUS | Status: DC | PRN
  Administered 2024-02-17 (×2): 2.5 mg via INTRA_ARTERIAL

## 2024-02-17 MED ORDER — POTASSIUM CHLORIDE CRYS ER 20 MEQ PO TBCR
40.0000 meq | EXTENDED_RELEASE_TABLET | Freq: Once | ORAL | Status: AC
  Administered 2024-02-17: 40 meq via ORAL
  Filled 2024-02-17: qty 2

## 2024-02-17 MED ORDER — LIDOCAINE HCL (PF) 1 % IJ SOLN
INTRAMUSCULAR | Status: DC | PRN
Start: 2024-02-17 — End: 2024-02-17
  Administered 2024-02-17: 5 mL

## 2024-02-17 MED ORDER — AMIODARONE HCL IN DEXTROSE 360-4.14 MG/200ML-% IV SOLN
30.0000 mg/h | INTRAVENOUS | Status: DC
  Administered 2024-02-17: 30 mg/h via INTRAVENOUS
  Filled 2024-02-17 (×2): qty 200

## 2024-02-17 MED ORDER — FUROSEMIDE 10 MG/ML IJ SOLN
20.0000 mg | Freq: Every day | INTRAMUSCULAR | Status: DC
  Administered 2024-02-17 – 2024-02-18 (×2): 20 mg via INTRAVENOUS
  Filled 2024-02-17 (×2): qty 2

## 2024-02-17 MED ORDER — HEPARIN SODIUM (PORCINE) 1000 UNIT/ML IJ SOLN
INTRAMUSCULAR | Status: AC
  Filled 2024-02-17: qty 10

## 2024-02-17 MED ORDER — AMIODARONE LOAD VIA INFUSION
150.0000 mg | Freq: Once | INTRAVENOUS | Status: AC
  Administered 2024-02-17: 150 mg via INTRAVENOUS
  Filled 2024-02-17: qty 83.34

## 2024-02-17 MED ORDER — FENTANYL CITRATE (PF) 100 MCG/2ML IJ SOLN
INTRAMUSCULAR | Status: DC | PRN
  Administered 2024-02-17: 12.5 ug via INTRAVENOUS

## 2024-02-17 MED ORDER — HEPARIN (PORCINE) 25000 UT/250ML-% IV SOLN
1450.0000 [IU]/h | INTRAVENOUS | Status: DC
  Administered 2024-02-17: 1100 [IU]/h via INTRAVENOUS
  Filled 2024-02-17 (×3): qty 250

## 2024-02-17 MED ORDER — FENTANYL CITRATE (PF) 100 MCG/2ML IJ SOLN
INTRAMUSCULAR | Status: AC
  Filled 2024-02-17: qty 2

## 2024-02-17 MED ORDER — AMIODARONE HCL 150 MG/3ML IV SOLN
INTRAVENOUS | Status: AC
  Filled 2024-02-17: qty 3

## 2024-02-17 MED ORDER — ENOXAPARIN SODIUM 40 MG/0.4ML IJ SOSY
40.0000 mg | PREFILLED_SYRINGE | INTRAMUSCULAR | Status: DC
  Filled 2024-02-17 (×2): qty 0.4

## 2024-02-17 MED ORDER — HYDROCHLOROTHIAZIDE 12.5 MG PO TABS: 12.5000 mg | ORAL_TABLET | Freq: Every day | ORAL | Status: DC

## 2024-02-17 MED ORDER — MIDAZOLAM HCL 2 MG/2ML IJ SOLN
INTRAMUSCULAR | Status: DC | PRN
  Administered 2024-02-17: .5 mg via INTRAVENOUS

## 2024-02-17 MED ORDER — AMIODARONE HCL IN DEXTROSE 360-4.14 MG/200ML-% IV SOLN
INTRAVENOUS | Status: AC
  Filled 2024-02-17: qty 200

## 2024-02-17 MED ORDER — HEPARIN BOLUS VIA INFUSION
2000.0000 [IU] | Freq: Once | INTRAVENOUS | Status: AC
  Administered 2024-02-17: 2000 [IU] via INTRAVENOUS
  Filled 2024-02-17: qty 2000

## 2024-02-17 MED ORDER — HEPARIN (PORCINE) IN NACL 1000-0.9 UT/500ML-% IV SOLN
INTRAVENOUS | Status: AC
  Filled 2024-02-17: qty 1000

## 2024-02-17 MED ORDER — SODIUM CHLORIDE 0.9% FLUSH: 3.0000 mL | INTRAVENOUS | Status: DC | PRN

## 2024-02-17 MED ORDER — LOSARTAN POTASSIUM 25 MG PO TABS
12.5000 mg | ORAL_TABLET | Freq: Every day | ORAL | Status: DC
  Administered 2024-02-17: 12.5 mg via ORAL
  Filled 2024-02-17 (×4): qty 0.5

## 2024-02-17 MED ORDER — MAGNESIUM SULFATE 2 GM/50ML IV SOLN
2.0000 g | Freq: Once | INTRAVENOUS | Status: AC
  Administered 2024-02-17: 2 g via INTRAVENOUS
  Filled 2024-02-17: qty 50

## 2024-02-17 MED ORDER — LIDOCAINE HCL 1 % IJ SOLN
INTRAMUSCULAR | Status: AC
  Filled 2024-02-17: qty 20

## 2024-02-17 MED ORDER — AMIODARONE HCL IN DEXTROSE 360-4.14 MG/200ML-% IV SOLN
60.0000 mg/h | INTRAVENOUS | Status: DC
  Administered 2024-02-17: 60 mg/h via INTRAVENOUS

## 2024-02-17 MED ORDER — SODIUM CHLORIDE 0.9 % WEIGHT BASED INFUSION
3.0000 mL/kg/h | INTRAVENOUS | Status: DC
  Administered 2024-02-17: 3 mL/kg/h via INTRAVENOUS

## 2024-02-17 MED ORDER — SODIUM CHLORIDE 0.9% FLUSH
3.0000 mL | Freq: Two times a day (BID) | INTRAVENOUS | Status: DC
  Administered 2024-02-17 – 2024-02-20 (×6): 3 mL via INTRAVENOUS

## 2024-02-17 SURGICAL SUPPLY — 9 items
CATH INFINITI 5 FR JL3.5 (CATHETERS) IMPLANT
CATH INFINITI JR4 5F (CATHETERS) IMPLANT
DEVICE RAD TR BAND REGULAR (VASCULAR PRODUCTS) IMPLANT
DRAPE BRACHIAL (DRAPES) IMPLANT
GLIDESHEATH SLEND SS 6F .021 (SHEATH) IMPLANT
GUIDEWIRE INQWIRE 1.5J.035X260 (WIRE) IMPLANT
PACK CARDIAC CATH (CUSTOM PROCEDURE TRAY) ×1 IMPLANT
SET ATX-X65L (MISCELLANEOUS) IMPLANT
STATION PROTECTION PRESSURIZED (MISCELLANEOUS) IMPLANT

## 2024-02-17 NOTE — H&P (Signed)
 History and Physical    Patient: Laura Cohen DOB: 1955-09-15 DOA: 02/16/2024 DOS: the patient was seen and examined on 02/17/2024 PCP: Loreli Kins, MD  Patient coming from: Home  Chief Complaint:  Chief Complaint  Patient presents with   Chest Pain   HPI: Laura Cohen is a 68 y.o. female with medical history significant of hypertension, hyperlipidemia.  She presented to the emergency room with complaints of left-sided chest pain and back pain since last night.  Patient apparently had found her mother dead on the floor last night while doing a welfare check on her.  This shocked her extremely and reports having developed symptoms of back pain and chest tightness.  She denied any radiation to the jaw or arm.  Denied any associated exertional symptoms.  Pain has been constant.  She however complains of difficulty catching her breath.  She denies any previous history of coronary disease.  Workup in the ED revealed troponin greater than 1000.  EKG did show evidence of T wave inversions in inferior and lateral leads.  Review of Systems: As mentioned in the history of present illness. All other systems reviewed and are negative. Past Medical History:  Diagnosis Date   Hematuria    Hemorrhoids    Hyperlipidemia    Hypertension    Insomnia    Menopausal symptoms    Osteopenia    Past Surgical History:  Procedure Laterality Date   ABDOMINAL HYSTERECTOMY     BREAST EXCISIONAL BIOPSY     BREAST SURGERY     Social History:  reports that she has never smoked. She does not have any smokeless tobacco history on file. She reports that she does not drink alcohol and does not use drugs.  Allergies  Allergen Reactions   Alendronate Other (See Comments)   Penicillins Hives and Itching   Amoxicillin Rash   Amoxicillin-Pot Clavulanate Rash    Family History  Problem Relation Age of Onset   Hypertension Mother    CAD Mother    Hypertension Father    Dementia Father     Cancer Sister    Melanoma Brother    Breast cancer Cousin    Hypertension Other     Prior to Admission medications   Medication Sig Start Date End Date Taking? Authorizing Provider  acetaminophen  (TYLENOL ) 500 MG tablet Take 1,000 mg by mouth every 8 (eight) hours as needed for moderate pain (pain score 4-6).   Yes [provider]  aspirin  EC 81 MG tablet Take 81 mg by mouth daily. Swallow whole.   Yes [provider]  B Complex Vitamins (VITAMIN B COMPLEX PO) Take 1 tablet by mouth daily.   Yes [provider]  diclofenac Sodium (VOLTAREN) 1 % GEL Apply 2-4 g topically daily as needed.   Yes [provider]  ibuprofen (ADVIL) 200 MG tablet Take 400 mg by mouth every 6 (six) hours as needed for mild pain (pain score 1-3) or moderate pain (pain score 4-6).   Yes [provider]  lisinopril -hydrochlorothiazide  (PRINZIDE ,ZESTORETIC ) 10-12.5 MG per tablet Take 1 tablet by mouth every other day. 01/18/14  Yes [provider]  Multiple Vitamin (MULTIVITAMIN) tablet Take 1 tablet by mouth daily.   Yes [provider]  Omega-3 Fatty Acids (FISH OIL PO) Take 1 capsule by mouth daily.   Yes [provider]  rosuvastatin  (CRESTOR ) 5 MG tablet Take 5 mg by mouth daily.   Yes [provider]  BIOTIN PO Take 2  each by mouth daily.    [provider]  Misc Natural Products (TURMERIC, CURCUMIN, PO) Take 2 each by mouth daily.    [provider]    Physical Exam: Vitals:   02/16/24 2133 02/16/24 2221 02/16/24 2221 02/16/24 2300  BP:   127/72 (!) 115/57  Pulse:   93 77  Resp:  19 18 14   Temp:   98 F (36.7 C)   TempSrc:   Oral   SpO2:   100% 96%  Weight: 68 kg     Height: 5' 3 (1.6 m)      General: Patient looks well in no acute distress HEENT: Oral mucosa moist.  EOMI Neck: Supple with no JVD Chest: Clinically clear without any added sounds Cardiovascular: Regular S1-S2 with no murmur Abdomen:  Soft nontender with no organomegaly CNS shows no focal deficits Skin is negative for any new rash. Data Reviewed: Troponin admission was 1187, repeat troponin was 911 WBCs 19.6, hemoglobin 13.8, hematocrit 39, platelet count 188, neutrophil count 79.  Chemistry panel shows sodium of 131, potassium 3.8, chloride 95, bicarb 24, glucose 132, BUN 30, creatinine 0.59,  Chest x-ray shows no active cardiopulmonary disease. Assessment and Plan:  68 year old with medical comorbidities that include hypertension and hyperlipidemia.  She presented to the emergency room with complaints of left-sided chest pain and back pain.  Onset of symptoms started after patient who had an unfortunate witness of her mother dead on the floor due to natural causes.  The shock of this finding, resulted in chest pain  Acute NSTEMI: Likely ACS related.  Other differentials include broken heart syndrome.  Elevated troponin consistent with NSTEMI.  Time 0 was last night.  Troponin peaked at 1187.  Patient was initiated on heparin  protocol on admission for ACS.  Will consult with cardiology to evaluate patient for further restratification.  In the interim, patient will be continued on aspirin  and statins.  D-dimer has also been ordered to rule out any underlying pulmonary component.  Hypertension: Patient is on lisinopril /hydrochlorothiazide .  Consider adding beta-blockers due to ACS.  Dyslipidemia: Patient will continue statins  VTE prophylaxis: Patient is on Lovenox  Advance Care Planning:   Code Status: Full Code   Consults:   Family Communication:   Severity of Illness: The appropriate patient status for this patient is INPATIENT. Inpatient status is judged to be reasonable and necessary in order to provide the required intensity of service to ensure the patient's safety. The patient's presenting symptoms, physical exam findings, and initial radiographic and laboratory data in the context of their chronic comorbidities  is felt to place them at high risk for further clinical deterioration. Furthermore, it is not anticipated that the patient will be medically stable for discharge from the hospital within 2 midnights of admission.   * I certify that at the point of admission it is my clinical judgment that the patient will require inpatient hospital care spanning beyond 2 midnights from the point of admission due to high intensity of service, high risk for further deterioration and high frequency of surveillance required.*  Author: Maude MARLA Dart, MD 02/17/2024 12:13 AM  For on call review [email or web address].

## 2024-02-17 NOTE — Progress Notes (Signed)
 HR variable still from 120's -170's:ST, Atrial tach bursts. Pt. States she can feel it  once in a while.BP 90/63. Report called to ICU Stepdown RN.

## 2024-02-17 NOTE — Progress Notes (Signed)
 Dr. Mady here now to speak with pt.& husband Re: HR, change in rate/rhythm.

## 2024-02-17 NOTE — Progress Notes (Signed)
 PHARMACY - ANTICOAGULATION CONSULT NOTE  Pharmacy Consult for Heparin   Indication: chest pain/ACS  Allergies  Allergen Reactions   Alendronate Other (See Comments)   Penicillins Hives and Itching   Amoxicillin Rash   Amoxicillin-Pot Clavulanate Rash    Patient Measurements: Height: 5' 3 (160 cm) Weight: 68 kg (150 lb) IBW/kg (Calculated) : 52.4 HEPARIN  DW (KG): 66.3  Vital Signs: Temp: 98.4 F (36.9 C) (07/18 0500) Temp Source: Oral (07/18 0500) BP: 103/61 (07/18 0500) Pulse Rate: 78 (07/18 0500)  Labs: Recent Labs    02/16/24 2136 02/16/24 2247 02/16/24 2317 02/17/24 0337  HGB 13.8  --   --  12.8  HCT 39.9  --   --  37.0  PLT 288  --   --  252  APTT  --  30  --   --   HEPARINUNFRC  --   --   --  0.29*  CREATININE 0.59  --   --  0.48  TROPONINIHS 1,187*  --  911*  --     Estimated Creatinine Clearance: 63.1 mL/min (by C-G formula based on SCr of 0.48 mg/dL).   Medical History: Past Medical History:  Diagnosis Date   Hematuria    Hemorrhoids    Hyperlipidemia    Hypertension    Insomnia    Menopausal symptoms    Osteopenia     Medications:  Medications Prior to Admission  Medication Sig Dispense Refill Last Dose/Taking   acetaminophen  (TYLENOL ) 500 MG tablet Take 1,000 mg by mouth every 8 (eight) hours as needed for moderate pain (pain score 4-6).   02/16/2024 Noon   aspirin  EC 81 MG tablet Take 81 mg by mouth daily. Swallow whole.   02/16/2024 at  8:00 AM   B Complex Vitamins (VITAMIN B COMPLEX PO) Take 1 tablet by mouth daily.   02/16/2024 at  8:00 AM   diclofenac Sodium (VOLTAREN) 1 % GEL Apply 2-4 g topically daily as needed.   Unknown   ibuprofen (ADVIL) 200 MG tablet Take 400 mg by mouth every 6 (six) hours as needed for mild pain (pain score 1-3) or moderate pain (pain score 4-6).   02/15/2024 Bedtime   lisinopril -hydrochlorothiazide  (PRINZIDE ,ZESTORETIC ) 10-12.5 MG per tablet Take 1 tablet by mouth every other day.   02/16/2024 at  8:00 AM    Multiple Vitamin (MULTIVITAMIN) tablet Take 1 tablet by mouth daily.   02/16/2024 Morning   Omega-3 Fatty Acids (FISH OIL PO) Take 1 capsule by mouth daily.   Unknown   rosuvastatin  (CRESTOR ) 5 MG tablet Take 5 mg by mouth daily.   02/16/2024 at  8:00 AM   BIOTIN PO Take 2 each by mouth daily.      Misc Natural Products (TURMERIC, CURCUMIN, PO) Take 2 each by mouth daily.       Assessment: Pharmacy consulted to dose heparin  in this 68 year old female admitted with ACS/NSTEMI.  No prior anticoag noted. CrCl = 63.1 ml/min  Goal of Therapy:  Heparin  level 0.3-0.7 units/ml Monitor platelets by anticoagulation protocol: Yes   Plan:  7/18:  HL @ 0337 = 0.29, SUBtherapeutic - Will order heparin  1000 units IV X 1 bolus and increase drip rate to 900 units/hr - recheck HL 6 hrs after rate change  - CBC daily   Jayden Rudge D 02/17/2024,5:30 AM

## 2024-02-17 NOTE — Progress Notes (Signed)
 [facility] Progress Note  Patient: Laura Cohen    FMW:[id number]  DOA: 02/16/2024     Date of Service: the patient was seen and examined on 02/17/2024  Chief Complaint  Patient presents with   Chest Pain   Brief hospital course: Laura Cohen is a 68 y.o. female with medical history significant of hypertension, hyperlipidemia.  She presented to the emergency room with complaints of left-sided chest pain and back pain since last night.  Patient apparently had found her mother dead on the floor last night while doing a welfare check on her.  This shocked her extremely and reports having developed symptoms of back pain and chest tightness.  She denied any radiation to the jaw or arm.  Denied any associated exertional symptoms.  Pain has been constant.  She however complains of difficulty catching her breath.  She denies any previous history of coronary disease.   Workup in the ED revealed troponin greater than 1000.  EKG did show evidence of T wave inversions in inferior and lateral leads.     Assessment and Plan:  # Acute NSTEMI: Likely ACS related vs broken heart syndrome Elevated troponin consistent with NSTEMI.   Troponin peaked at 1187.   - Heparin  IV infusion  - Aspirin  81 mg p.o. daily -Nitroglycerin  as needed -Lipitor 40 mg p.o. daily D-dimer negative, less likely PE BNP 870 elevated Follow-up 2D echocardiogram Cardiology consulted, recommended cardiac cath which will be done today.    # Hypertension: BP soft and stable Home med lisinopril /hydrochlorothiazide  held for now Consider adding beta-blockers due to ACS. Monitor BP and titrate medications accordingly   # Dyslipidemia: Lipitor 40 mg. LDL 35, below goal 70-100     Body mass index is 27.17 kg/m.  Interventions:  Diet: N.p.o. for cardiac cath DVT Prophylaxis: Heparin  IV infusion  Advance goals of care discussion: Full code  Family Communication: family was present at bedside, at the time of  interview.  The pt provided permission to discuss medical plan with the family. Opportunity was given to ask question and all questions were answered satisfactorily.   Disposition:  Pt is from home, admitted with non-STEMI, pending cardiac, which precludes a safe discharge. Discharge to home, when stable and cleared by cardiology, most likely tomorrow a.m.  Subjective: No significant events overnight, patient still has chest pain, feels pressure 2/10 on the left side, no radiation.  Denies any other complaints. Patient is aware that she is scheduled for cardiac cath as per cardiology and she agreed for the procedure   Physical Exam: General: NAD, lying comfortably Appear in no distress, affect appropriate Eyes: PERRLA ENT: Oral Mucosa Clear, moist  Neck: no JVD,  Cardiovascular: S1 and S2 Present, no Murmur,  Respiratory: good respiratory effort, Bilateral Air entry equal and Decreased, no Crackles, no wheezes Abdomen: Bowel Sound present, Soft and no tenderness,  Skin: no rashes Extremities: no Pedal edema, no calf tenderness Neurologic: without any new focal findings Gait not checked due to patient safety concerns  Vitals:   02/17/24 0500 02/17/24 0820 02/17/24 1005 02/17/24 1126  BP: 103/61 111/65  129/65  Pulse: 78 95  90  Resp: 18 18  16   Temp: 98.4 F (36.9 C) 99 F (37.2 C)  99.4 F (37.4 C)  TempSrc: Oral     SpO2: 97% 97%  96%  Weight:   69.6 kg   Height:        Intake/Output Summary (Last 24 hours) at 02/17/2024 1402 Last data filed  at 02/17/2024 0630 Gross per 24 hour  Intake 109.05 ml  Output --  Net 109.05 ml   Filed Weights   02/16/24 2133 02/17/24 1005  Weight: 68 kg 69.6 kg    Data Reviewed: I have personally reviewed and interpreted daily labs, tele strips, imagings as discussed above. I reviewed all nursing notes, pharmacy notes, vitals, pertinent old records I have discussed plan of care as described above with RN and  patient/family.  CBC: Recent Labs  Lab 02/16/24 2136 02/17/24 0337  WBC 19.6* 15.6*  HGB 13.8 12.8  HCT 39.9 37.0  MCV 88.1 87.1  PLT 288 252   Basic Metabolic Panel: Recent Labs  Lab 02/16/24 2136 02/17/24 0337  NA 131* 130*  K 3.8 3.6  CL 95* 98  CO2 24 23  GLUCOSE 132* 122*  BUN 13 11  CREATININE 0.59 0.48  CALCIUM  8.9 8.4*  MG  --  1.8  PHOS  --  2.6    Studies: DG Chest 2 View Result Date: 02/16/2024 CLINICAL DATA:  Chest pain EXAM: CHEST - 2 VIEW COMPARISON:  None Available. FINDINGS: The heart size and mediastinal contours are within normal limits. Both lungs are clear. The visualized skeletal structures are unremarkable. IMPRESSION: No active cardiopulmonary disease. Electronically Signed   By: Oneil Devonshire M.D.   On: 02/16/2024 21:59    Scheduled Meds:  aspirin   324 mg Oral NOW   Or   aspirin   300 mg Rectal NOW   [START ON 02/18/2024] aspirin   81 mg Oral Pre-Cath   aspirin  EC  81 mg Oral Daily   rosuvastatin   40 mg Oral Daily   Continuous Infusions:  [START ON 02/18/2024] sodium chloride     Followed by   NOREEN ON 02/18/2024] sodium chloride     heparin  1,100 Units/hr (02/17/24 1321)   PRN Meds: acetaminophen , acetaminophen , nitroGLYCERIN , ondansetron  (ZOFRAN ) IV  Time spent: 35 minutes  Author: ELVAN SOR. MD [facility] 02/17/2024 2:02 PM  To reach On-call, see care teams to locate the attending and reach out to them via [email or web address]. If 7PM-7AM, please contact night-coverage If you still have difficulty reaching the attending provider, please page the [facility] (Director on Call) for [facility] on amion for assistance.

## 2024-02-17 NOTE — Progress Notes (Addendum)
 MD still here: results seen of K+ and Magnesium. New orders received. MD aware of BP (88/58). MD states he did NOT want to treat Magnesium right now since it may affect her BP and heart situation. MD also requests to give p.o. Potassium (2nd dose) until 9 PM this eve.

## 2024-02-17 NOTE — Plan of Care (Signed)

## 2024-02-17 NOTE — Consult Note (Signed)
 Cardiology Consultation:   Patient ID: Laura Cohen; [id number]; 1956-04-07   Admit date: 02/16/2024 Date of Consult: 02/17/2024  Primary Care Provider: Loreli Kins, MD Primary Cardiologist: [place] Primary Electrophysiologist:  None   Patient Profile:   Laura Cohen is a 68 y.o. female with a hx of palpitations with atrial tachycardia, HTN, HLD, and remote brief tobacco use who is being seen today for the evaluation of NSTEMI at the request of Dr. Marca.  History of Present Illness:   Laura Cohen was evaluated in our office in 09/2021 for palpitations.  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  Zio patch in 09/2021 showed a predominant rhythm of sinus with an average rate of 66 bpm, 1 run of NSVT lasting 6 beats with a rate of 118 bpm, 13 episodes of atrial tachycardia lasting up to 14 beats with an average rate of 124 bpm, and rare PACs and PVCs.  Echo in 09/2021 showed an EF of 65 to 70%, no regional wall motion abnormalities, grade 1 diastolic dysfunction, normal RV systolic function, ventricular cavity size, and RVSP, trivial mitral regurgitation, and an estimated right atrial pressure of 3 mmHg.  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  This was followed by a sensation of diffuse attention and her muscles associated with chest tightness radiating to the back and tachypalpitations.  Leading up to these events, she was without symptoms of angina or cardiac decompensation and had her typical baseline.  Her chest tightness/soreness persisted throughout the day on 7/17 prompting her to come to the ER.  Upon arrival she was found to be hemodynamically stable, afebrile, and with oxygen saturation 100% on room air.  Initial EKG showed NSR, 92 bpm, possible prior anterior infarct, prolonged QT, and inferolateral T wave inversion which persisted on repeat tracing.  Initial and peak high-sensitivity  troponin 1187 trending to 911.  D-dimer negative.  WBC 19.6 trending to 15.6.  Hgb 13.8.  PLT 288.  Sodium 131 trending to 130.  Potassium 3.8 trending to 3.6.  Chest x-ray without active cardiopulmonary process.  In the ED she was given ASA 324 mg x 1, SL NTG x 2, and placed on a heparin  drip.  Currently, notes mild chest discomfort rated at 3 out of 10.    Past Medical History:  Diagnosis Date   Hematuria    Hemorrhoids    Hyperlipidemia    Hypertension    Insomnia    Menopausal symptoms    Osteopenia     Past Surgical History:  Procedure Laterality Date   ABDOMINAL HYSTERECTOMY     BREAST EXCISIONAL BIOPSY     BREAST SURGERY       Home Meds: Prior to Admission medications   Medication Sig Start Date End Date Taking? Authorizing Provider  acetaminophen  (TYLENOL ) 500 MG tablet Take 1,000 mg by mouth every 8 (eight) hours as needed for moderate pain (pain score 4-6).   Yes [provider]  aspirin  EC 81 MG tablet Take 81 mg by mouth daily. Swallow whole.   Yes [provider]  B Complex Vitamins (VITAMIN B COMPLEX PO) Take 1 tablet by mouth daily.   Yes [provider]  diclofenac Sodium (VOLTAREN) 1 % GEL Apply 2-4 g topically daily as needed.   Yes [provider]  ibuprofen (ADVIL) 200 MG tablet Take 400 mg by mouth every 6 (six) hours as  needed for mild pain (pain score 1-3) or moderate pain (pain score 4-6).   Yes [provider]  lisinopril -hydrochlorothiazide  (PRINZIDE ,ZESTORETIC ) 10-12.5 MG per tablet Take 1 tablet by mouth every other day. 01/18/14  Yes [provider]  Multiple Vitamin (MULTIVITAMIN) tablet Take 1 tablet by mouth daily.   Yes [provider]  Omega-3 Fatty Acids (FISH OIL PO) Take 1 capsule by mouth daily.   Yes [provider]  rosuvastatin  (CRESTOR ) 5 MG tablet Take 5 mg by mouth daily.   Yes [provider]  BIOTIN PO Take 2 each by mouth daily.    [provider]   Misc Natural Products (TURMERIC, CURCUMIN, PO) Take 2 each by mouth daily.    [provider]    Inpatient Medications: Scheduled Meds:  aspirin   324 mg Oral NOW   Or   aspirin   300 mg Rectal NOW   aspirin  EC  81 mg Oral Daily   rosuvastatin   40 mg Oral Daily   Continuous Infusions:  heparin  900 Units/hr (02/17/24 0533)   PRN Meds: acetaminophen , acetaminophen , nitroGLYCERIN , ondansetron  (ZOFRAN ) IV  Allergies:   Allergies  Allergen Reactions   Alendronate Other (See Comments)   Penicillins Hives and Itching   Amoxicillin Rash   Amoxicillin-Pot Clavulanate Rash    Social History:   Social History   Socioeconomic History   Marital status: Married    Spouse name: Not on file   Number of children: Not on file   Years of education: Not on file   Highest education level: Not on file  Occupational History   Not on file  Tobacco Use   Smoking status: Never   Smokeless tobacco: Not on file  Substance and Sexual Activity   Alcohol use: No    Alcohol/week: 0.0 standard drinks of alcohol   Drug use: No   Sexual activity: Not on file  Other Topics Concern   Not on file  Social History Narrative   Not on file   Social Drivers of Health   Financial Resource Strain: Not on file  Food Insecurity: No Food Insecurity (02/17/2024)   Hunger Vital Sign    Worried About Running Out of Food in the Last Year: Never true    Ran Out of Food in the Last Year: Never true  Transportation Needs: No Transportation Needs (02/17/2024)   PRAPARE - Administrator, Civil Service (Medical): No    Lack of Transportation (Non-Medical): No  Physical Activity: Not on file  Stress: Not on file  Social Connections: Socially Integrated (02/17/2024)   Social Connection and Isolation Panel    Frequency of Communication with Friends and Family: More than three times a week    Frequency of Social Gatherings with Friends and Family: Once a week    Attends Religious Services: More  than 4 times per year    Active Member of [employer] or Organizations: Yes    Attends Engineer, structural: More than 4 times per year    Marital Status: Married  Catering manager Violence: Not At Risk (02/17/2024)   Humiliation, Afraid, Rape, and Kick questionnaire    Fear of Current or Ex-Partner: No    Emotionally Abused: No    Physically Abused: No    Sexually Abused: No     Family History:   Family History  Problem Relation Age of Onset   Hypertension Mother    CAD Mother    Hypertension Father    Dementia Father  Cancer Sister    Melanoma Brother    Breast cancer Cousin    Hypertension Other     ROS:  Review of Systems  Constitutional:  Positive for malaise/fatigue. Negative for chills, diaphoresis, fever and weight loss.  HENT:  Negative for congestion.   Eyes:  Negative for discharge and redness.  Respiratory:  Negative for cough, sputum production, shortness of breath and wheezing.   Cardiovascular:  Positive for chest pain. Negative for palpitations, orthopnea, claudication, leg swelling and PND.  Gastrointestinal:  Positive for nausea and vomiting. Negative for abdominal pain and heartburn.  Genitourinary:  Negative for hematuria.  Musculoskeletal:  Positive for back pain. Negative for falls and myalgias.  Skin:  Negative for rash.  Neurological:  Negative for dizziness, tingling, tremors, sensory change, speech change, focal weakness, loss of consciousness and weakness.  Endo/Heme/Allergies:  Does not bruise/bleed easily.  Psychiatric/Behavioral:  Negative for substance abuse. The patient is not nervous/anxious.   All other systems reviewed and are negative.     Physical Exam/Data:   Vitals:   02/17/24 0000 02/17/24 0050 02/17/24 0500 02/17/24 0820  BP: 116/65 117/62 103/61 111/65  Pulse: 83 79 78 95  Resp: 19 18 18 18   Temp:  98.6 F (37 C) 98.4 F (36.9 C) 99 F (37.2 C)  TempSrc:  Oral Oral   SpO2: 97% 96% 97% 97%  Weight:      Height:         Intake/Output Summary (Last 24 hours) at 02/17/2024 1003 Last data filed at 02/17/2024 0630 Gross per 24 hour  Intake 109.05 ml  Output --  Net 109.05 ml   Filed Weights   02/16/24 2133  Weight: 68 kg   Body mass index is 26.57 kg/m.   Physical Exam: General: Well developed, well nourished, in no acute distress. Head: Normocephalic, atraumatic, sclera non-icteric, no xanthomas, nares without discharge.  Neck: Negative for carotid bruits. JVD not elevated. Lungs: Clear bilaterally to auscultation without wheezes, rales, or rhonchi. Breathing is unlabored. Heart: RRR with S1 S2. No murmurs, rubs, or gallops appreciated. Abdomen: Soft, non-tender, non-distended with normoactive bowel sounds. No hepatomegaly. No rebound/guarding. No obvious abdominal masses. Msk:  Strength and tone appear normal for age. Extremities: No clubbing or cyanosis. No edema. Distal pedal pulses are 2+ and equal bilaterally. Neuro: Alert and oriented X 3. No facial asymmetry. No focal deficit. Moves all extremities spontaneously. Psych:  Responds to questions appropriately with a normal affect.   EKG:  The EKG was personally reviewed and demonstrates: NSR, 92 bpm, possible prior anterior infarct, prolonged QT, and inferolateral T wave inversion which persisted on repeat tracing Telemetry:  Telemetry was personally reviewed and demonstrates: Sinus rhythm with PVCs and short runs of NSVT  Weights: [facility] Weights   02/16/24 2133  Weight: 68 kg    Relevant CV Studies:  Zio patch 09/2021: Sinus rhythm average heart rate 66 bpm. 1 run of nonsustained VT 6 beats average heart rate 118 bpm, slow. 13 brief runs of atrial tachycardia, longest 14 beats with average of 124 bpm Rare PACs, rare PVCs. No atrial fibrillation, no pauses __________  2D echo 10/21/2021: 1. Left ventricular ejection fraction, by estimation, is 65 to 70%. Left  ventricular ejection fraction by 3D volume is 67 %. The left ventricle  has  normal function. The left ventricle has no regional wall motion  abnormalities. Left ventricular diastolic   parameters are consistent with Grade I diastolic dysfunction (impaired  relaxation).   2. Right ventricular  systolic function is normal. The right ventricular  size is normal. There is normal pulmonary artery systolic pressure. The  estimated right ventricular systolic pressure is 28.6 mmHg.   3. The mitral valve is grossly normal. Trivial mitral valve  regurgitation.   4. The aortic valve is tricuspid. Aortic valve regurgitation is not  visualized.   5. The inferior vena cava is normal in size with greater than 50%  respiratory variability, suggesting right atrial pressure of 3 mmHg.     Laboratory Data:  Chemistry Recent Labs  Lab 02/16/24 2136 02/17/24 0337  NA 131* 130*  K 3.8 3.6  CL 95* 98  CO2 24 23  GLUCOSE 132* 122*  BUN 13 11  CREATININE 0.59 0.48  CALCIUM  8.9 8.4*  GFRNONAA >60 >60  ANIONGAP 12 9    No results for input(s): PROT, ALBUMIN, AST, ALT, ALKPHOS, BILITOT in the last 168 hours. Hematology Recent Labs  Lab 02/16/24 2136 02/17/24 0337  WBC 19.6* 15.6*  RBC 4.53 4.25  HGB 13.8 12.8  HCT 39.9 37.0  MCV 88.1 87.1  MCH 30.5 30.1  MCHC 34.6 34.6  RDW 12.4 12.3  PLT 288 252   Cardiac EnzymesNo results for input(s): TROPONINI in the last 168 hours. No results for input(s): TROPIPOC in the last 168 hours.  BNP Recent Labs  Lab 02/17/24 0337  BNP 870.3*    DDimer  Recent Labs  Lab 02/17/24 0127  DDIMER <0.27    Radiology/Studies:  DG Chest 2 View Result Date: 02/16/2024 IMPRESSION: No active cardiopulmonary disease. Electronically Signed   By: Oneil Devonshire M.D.   On: 02/16/2024 21:59    Assessment and Plan:   1.  NSTEMI: - Currently, continues to note mild chest discomfort rated 3 out of 10 -She recently found her mother passed away on the floor the night prior while doing a welfare check, cannot exclude  some degree of Takotsubo - ASA 81 mg daily - Heparin  drip - Obtain echo - NPO, sips with meds - Plan for LHC today - LP(a) pending - Rosuvastatin  as outlined below - Escalate pharmacotherapy as indicated  2.  HTN: - Blood pressure well-controlled - Not currently requiring antihypertensive therapy  3.  HLD: - LDL 35 this admission - Rosuvastatin  40 mg  4.  Leukocytosis: - No obvious infection - Likely inflammatory in the setting of NSTEMI  5.  Hyponatremia: - Management per primary service  6.  Ventricular ectopy: - Potassium and magnesium normal - Anticipate adding beta-blocker following cath and echo   Informed Consent   Shared Decision Making/Informed Consent{  The risks [stroke (1 in 1000), death (1 in 1000), kidney failure [usually temporary] (1 in 500), bleeding (1 in 200), allergic reaction [possibly serious] (1 in 200)], benefits (diagnostic support and management of coronary artery disease) and alternatives of a cardiac catheterization were discussed in detail with Laura Cohen and she is willing to proceed.       For questions or updates, please contact [facility] Please consult [email or web address] for contact info under Cardiology/STEMI.   Signed, Bernardino Bring, PA-C [facility] Pager: [phone] 02/17/2024, 10:03 AM

## 2024-02-17 NOTE — Progress Notes (Signed)
 Pt. Went into Atach vs. Aflutter up to rate of 170. Note sent to MD. EKG done now per MD orders.

## 2024-02-17 NOTE — H&P (View-Only) (Signed)
 Cardiology Consultation:   Patient ID: Laura Cohen; [id number]; 1956-04-07   Admit date: 02/16/2024 Date of Consult: 02/17/2024  Primary Care Provider: Loreli Kins, MD Primary Cardiologist: Jeffrie Primary Electrophysiologist:  None   Patient Profile:   Laura Cohen is a 68 y.o. female with a hx of palpitations with atrial tachycardia, HTN, HLD, and remote brief tobacco use who is being seen today for the evaluation of NSTEMI at the request of Dr. Marca.  History of Present Illness:   Ms. Bayliss was evaluated in our office in 09/2021 for palpitations.  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  Zio patch in 09/2021 showed a predominant rhythm of sinus with an average rate of 66 bpm, 1 run of NSVT lasting 6 beats with a rate of 118 bpm, 13 episodes of atrial tachycardia lasting up to 14 beats with an average rate of 124 bpm, and rare PACs and PVCs.  Echo in 09/2021 showed an EF of 65 to 70%, no regional wall motion abnormalities, grade 1 diastolic dysfunction, normal RV systolic function, ventricular cavity size, and RVSP, trivial mitral regurgitation, and an estimated right atrial pressure of 3 mmHg.  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  This was followed by a sensation of diffuse attention and her muscles associated with chest tightness radiating to the back and tachypalpitations.  Leading up to these events, she was without symptoms of angina or cardiac decompensation and had her typical baseline.  Her chest tightness/soreness persisted throughout the day on 7/17 prompting her to come to the ER.  Upon arrival she was found to be hemodynamically stable, afebrile, and with oxygen saturation 100% on room air.  Initial EKG showed NSR, 92 bpm, possible prior anterior infarct, prolonged QT, and inferolateral T wave inversion which persisted on repeat tracing.  Initial and peak high-sensitivity  troponin 1187 trending to 911.  D-dimer negative.  WBC 19.6 trending to 15.6.  Hgb 13.8.  PLT 288.  Sodium 131 trending to 130.  Potassium 3.8 trending to 3.6.  Chest x-ray without active cardiopulmonary process.  In the ED she was given ASA 324 mg x 1, SL NTG x 2, and placed on a heparin  drip.  Currently, notes mild chest discomfort rated at 3 out of 10.    Past Medical History:  Diagnosis Date   Hematuria    Hemorrhoids    Hyperlipidemia    Hypertension    Insomnia    Menopausal symptoms    Osteopenia     Past Surgical History:  Procedure Laterality Date   ABDOMINAL HYSTERECTOMY     BREAST EXCISIONAL BIOPSY     BREAST SURGERY       Home Meds: Prior to Admission medications   Medication Sig Start Date End Date Taking? Authorizing Provider  acetaminophen  (TYLENOL ) 500 MG tablet Take 1,000 mg by mouth every 8 (eight) hours as needed for moderate pain (pain score 4-6).   Yes [provider]  aspirin  EC 81 MG tablet Take 81 mg by mouth daily. Swallow whole.   Yes [provider]  B Complex Vitamins (VITAMIN B COMPLEX PO) Take 1 tablet by mouth daily.   Yes [provider]  diclofenac Sodium (VOLTAREN) 1 % GEL Apply 2-4 g topically daily as needed.   Yes [provider]  ibuprofen (ADVIL) 200 MG tablet Take 400 mg by mouth every 6 (six) hours as  needed for mild pain (pain score 1-3) or moderate pain (pain score 4-6).   Yes [provider]  lisinopril -hydrochlorothiazide  (PRINZIDE ,ZESTORETIC ) 10-12.5 MG per tablet Take 1 tablet by mouth every other day. 01/18/14  Yes [provider]  Multiple Vitamin (MULTIVITAMIN) tablet Take 1 tablet by mouth daily.   Yes [provider]  Omega-3 Fatty Acids (FISH OIL PO) Take 1 capsule by mouth daily.   Yes [provider]  rosuvastatin  (CRESTOR ) 5 MG tablet Take 5 mg by mouth daily.   Yes [provider]  BIOTIN PO Take 2 each by mouth daily.    [provider]   Misc Natural Products (TURMERIC, CURCUMIN, PO) Take 2 each by mouth daily.    [provider]    Inpatient Medications: Scheduled Meds:  aspirin   324 mg Oral NOW   Or   aspirin   300 mg Rectal NOW   aspirin  EC  81 mg Oral Daily   rosuvastatin   40 mg Oral Daily   Continuous Infusions:  heparin  900 Units/hr (02/17/24 0533)   PRN Meds: acetaminophen , acetaminophen , nitroGLYCERIN , ondansetron  (ZOFRAN ) IV  Allergies:   Allergies  Allergen Reactions   Alendronate Other (See Comments)   Penicillins Hives and Itching   Amoxicillin Rash   Amoxicillin-Pot Clavulanate Rash    Social History:   Social History   Socioeconomic History   Marital status: Married    Spouse name: Not on file   Number of children: Not on file   Years of education: Not on file   Highest education level: Not on file  Occupational History   Not on file  Tobacco Use   Smoking status: Never   Smokeless tobacco: Not on file  Substance and Sexual Activity   Alcohol use: No    Alcohol/week: 0.0 standard drinks of alcohol   Drug use: No   Sexual activity: Not on file  Other Topics Concern   Not on file  Social History Narrative   Not on file   Social Drivers of Health   Financial Resource Strain: Not on file  Food Insecurity: No Food Insecurity (02/17/2024)   Hunger Vital Sign    Worried About Running Out of Food in the Last Year: Never true    Ran Out of Food in the Last Year: Never true  Transportation Needs: No Transportation Needs (02/17/2024)   PRAPARE - Administrator, Civil Service (Medical): No    Lack of Transportation (Non-Medical): No  Physical Activity: Not on file  Stress: Not on file  Social Connections: Socially Integrated (02/17/2024)   Social Connection and Isolation Panel    Frequency of Communication with Friends and Family: More than three times a week    Frequency of Social Gatherings with Friends and Family: Once a week    Attends Religious Services: More  than 4 times per year    Active Member of [employer] or Organizations: Yes    Attends Engineer, structural: More than 4 times per year    Marital Status: Married  Catering manager Violence: Not At Risk (02/17/2024)   Humiliation, Afraid, Rape, and Kick questionnaire    Fear of Current or Ex-Partner: No    Emotionally Abused: No    Physically Abused: No    Sexually Abused: No     Family History:   Family History  Problem Relation Age of Onset   Hypertension Mother    CAD Mother    Hypertension Father    Dementia Father  Cancer Sister    Melanoma Brother    Breast cancer Cousin    Hypertension Other     ROS:  Review of Systems  Constitutional:  Positive for malaise/fatigue. Negative for chills, diaphoresis, fever and weight loss.  HENT:  Negative for congestion.   Eyes:  Negative for discharge and redness.  Respiratory:  Negative for cough, sputum production, shortness of breath and wheezing.   Cardiovascular:  Positive for chest pain. Negative for palpitations, orthopnea, claudication, leg swelling and PND.  Gastrointestinal:  Positive for nausea and vomiting. Negative for abdominal pain and heartburn.  Genitourinary:  Negative for hematuria.  Musculoskeletal:  Positive for back pain. Negative for falls and myalgias.  Skin:  Negative for rash.  Neurological:  Negative for dizziness, tingling, tremors, sensory change, speech change, focal weakness, loss of consciousness and weakness.  Endo/Heme/Allergies:  Does not bruise/bleed easily.  Psychiatric/Behavioral:  Negative for substance abuse. The patient is not nervous/anxious.   All other systems reviewed and are negative.     Physical Exam/Data:   Vitals:   02/17/24 0000 02/17/24 0050 02/17/24 0500 02/17/24 0820  BP: 116/65 117/62 103/61 111/65  Pulse: 83 79 78 95  Resp: 19 18 18 18   Temp:  98.6 F (37 C) 98.4 F (36.9 C) 99 F (37.2 C)  TempSrc:  Oral Oral   SpO2: 97% 96% 97% 97%  Weight:      Height:         Intake/Output Summary (Last 24 hours) at 02/17/2024 1003 Last data filed at 02/17/2024 0630 Gross per 24 hour  Intake 109.05 ml  Output --  Net 109.05 ml   Filed Weights   02/16/24 2133  Weight: 68 kg   Body mass index is 26.57 kg/m.   Physical Exam: General: Well developed, well nourished, in no acute distress. Head: Normocephalic, atraumatic, sclera non-icteric, no xanthomas, nares without discharge.  Neck: Negative for carotid bruits. JVD not elevated. Lungs: Clear bilaterally to auscultation without wheezes, rales, or rhonchi. Breathing is unlabored. Heart: RRR with S1 S2. No murmurs, rubs, or gallops appreciated. Abdomen: Soft, non-tender, non-distended with normoactive bowel sounds. No hepatomegaly. No rebound/guarding. No obvious abdominal masses. Msk:  Strength and tone appear normal for age. Extremities: No clubbing or cyanosis. No edema. Distal pedal pulses are 2+ and equal bilaterally. Neuro: Alert and oriented X 3. No facial asymmetry. No focal deficit. Moves all extremities spontaneously. Psych:  Responds to questions appropriately with a normal affect.   EKG:  The EKG was personally reviewed and demonstrates: NSR, 92 bpm, possible prior anterior infarct, prolonged QT, and inferolateral T wave inversion which persisted on repeat tracing Telemetry:  Telemetry was personally reviewed and demonstrates: Sinus rhythm with PVCs and short runs of NSVT  Weights: [facility] Weights   02/16/24 2133  Weight: 68 kg    Relevant CV Studies:  Zio patch 09/2021: Sinus rhythm average heart rate 66 bpm. 1 run of nonsustained VT 6 beats average heart rate 118 bpm, slow. 13 brief runs of atrial tachycardia, longest 14 beats with average of 124 bpm Rare PACs, rare PVCs. No atrial fibrillation, no pauses __________  2D echo 10/21/2021: 1. Left ventricular ejection fraction, by estimation, is 65 to 70%. Left  ventricular ejection fraction by 3D volume is 67 %. The left ventricle  has  normal function. The left ventricle has no regional wall motion  abnormalities. Left ventricular diastolic   parameters are consistent with Grade I diastolic dysfunction (impaired  relaxation).   2. Right ventricular  systolic function is normal. The right ventricular  size is normal. There is normal pulmonary artery systolic pressure. The  estimated right ventricular systolic pressure is 28.6 mmHg.   3. The mitral valve is grossly normal. Trivial mitral valve  regurgitation.   4. The aortic valve is tricuspid. Aortic valve regurgitation is not  visualized.   5. The inferior vena cava is normal in size with greater than 50%  respiratory variability, suggesting right atrial pressure of 3 mmHg.     Laboratory Data:  Chemistry Recent Labs  Lab 02/16/24 2136 02/17/24 0337  NA 131* 130*  K 3.8 3.6  CL 95* 98  CO2 24 23  GLUCOSE 132* 122*  BUN 13 11  CREATININE 0.59 0.48  CALCIUM  8.9 8.4*  GFRNONAA >60 >60  ANIONGAP 12 9    No results for input(s): PROT, ALBUMIN, AST, ALT, ALKPHOS, BILITOT in the last 168 hours. Hematology Recent Labs  Lab 02/16/24 2136 02/17/24 0337  WBC 19.6* 15.6*  RBC 4.53 4.25  HGB 13.8 12.8  HCT 39.9 37.0  MCV 88.1 87.1  MCH 30.5 30.1  MCHC 34.6 34.6  RDW 12.4 12.3  PLT 288 252   Cardiac EnzymesNo results for input(s): TROPONINI in the last 168 hours. No results for input(s): TROPIPOC in the last 168 hours.  BNP Recent Labs  Lab 02/17/24 0337  BNP 870.3*    DDimer  Recent Labs  Lab 02/17/24 0127  DDIMER <0.27    Radiology/Studies:  DG Chest 2 View Result Date: 02/16/2024 IMPRESSION: No active cardiopulmonary disease. Electronically Signed   By: Oneil Devonshire M.D.   On: 02/16/2024 21:59    Assessment and Plan:   1.  NSTEMI: - Currently, continues to note mild chest discomfort rated 3 out of 10 -She recently found her mother passed away on the floor the night prior while doing a welfare check, cannot exclude  some degree of Takotsubo - ASA 81 mg daily - Heparin  drip - Obtain echo - NPO, sips with meds - Plan for LHC today - LP(a) pending - Rosuvastatin  as outlined below - Escalate pharmacotherapy as indicated  2.  HTN: - Blood pressure well-controlled - Not currently requiring antihypertensive therapy  3.  HLD: - LDL 35 this admission - Rosuvastatin  40 mg  4.  Leukocytosis: - No obvious infection - Likely inflammatory in the setting of NSTEMI  5.  Hyponatremia: - Management per primary service  6.  Ventricular ectopy: - Potassium and magnesium normal - Anticipate adding beta-blocker following cath and echo   Informed Consent   Shared Decision Making/Informed Consent{  The risks [stroke (1 in 1000), death (1 in 1000), kidney failure [usually temporary] (1 in 500), bleeding (1 in 200), allergic reaction [possibly serious] (1 in 200)], benefits (diagnostic support and management of coronary artery disease) and alternatives of a cardiac catheterization were discussed in detail with Ms. Rain[place] and she is willing to proceed.       For questions or updates, please contact [facility] Please consult [email or web address] for contact info under Cardiology/STEMI.   Signed, Bernardino Bring, PA-C [facility] Pager: [phone] 02/17/2024, 10:03 AM

## 2024-02-17 NOTE — Progress Notes (Signed)
 PHARMACY - ANTICOAGULATION CONSULT NOTE  Pharmacy Consult for IV Heparin   Indication: chest pain/ACS  Patient Measurements: Height: 5' 3 (160 cm) Weight: 69.6 kg (153 lb 6.4 oz) IBW/kg (Calculated) : 52.4 HEPARIN  DW (KG): 66.3  Labs: Recent Labs    02/16/24 2136 02/16/24 2247 02/16/24 2317 02/17/24 0337 02/17/24 1134  HGB 13.8  --   --  12.8  --   HCT 39.9  --   --  37.0  --   PLT 288  --   --  252  --   APTT  --  30  --   --   --   HEPARINUNFRC  --   --   --  0.29* 0.19*  CREATININE 0.59  --   --  0.48  --   TROPONINIHS 1,187*  --  911*  --   --     Estimated Creatinine Clearance: 63.9 mL/min (by C-G formula based on SCr of 0.48 mg/dL).   Medical History: Past Medical History:  Diagnosis Date   Hematuria    Hemorrhoids    Hyperlipidemia    Hypertension    Insomnia    Menopausal symptoms    Osteopenia    Assessment: Pharmacy consulted to dose heparin  in this 68 year old female admitted with ACS/NSTEMI.  No prior anticoag noted.  9281 0337 HL 0.29, subthera; 800 un/hr 0718 1134 HL 0.19, subthera; 900 un/hr  Goal of Therapy:  Heparin  level 0.3-0.7 units/ml Monitor platelets by anticoagulation protocol: Yes   Plan:  --Heparin  level is subtherapeutic --Heparin  2000 unit IV bolus and increase heparin  infusion rate to 1100 units/hr --Re-check HL 6 hours from rate change --Daily CBC per protocol while on IV heparin   Laura Cohen 02/17/2024,12:56 PM

## 2024-02-17 NOTE — Progress Notes (Signed)
 PHARMACY - ANTICOAGULATION CONSULT NOTE  Pharmacy Consult for IV Heparin   Indication: chest pain/ACS  Patient Measurements: Height: 5' 3 (160 cm) Weight: 69.6 kg (153 lb 7 oz) IBW/kg (Calculated) : 52.4 HEPARIN  DW (KG): 66.7  Labs: Recent Labs    02/16/24 2136 02/16/24 2247 02/16/24 2317 02/17/24 0337 02/17/24 1134  HGB 13.8  --   --  12.8  --   HCT 39.9  --   --  37.0  --   PLT 288  --   --  252  --   APTT  --  30  --   --   --   HEPARINUNFRC  --   --   --  0.29* 0.19*  CREATININE 0.59  --   --  0.48  --   TROPONINIHS 1,187*  --  911*  --   --     Estimated Creatinine Clearance: 63.9 mL/min (by C-G formula based on SCr of 0.48 mg/dL).   Medical History: Past Medical History:  Diagnosis Date   Hematuria    Hemorrhoids    Hyperlipidemia    Hypertension    Insomnia    Menopausal symptoms    Osteopenia    Assessment: Pharmacy consulted to dose heparin  in this 68 year old female admitted with ACS/NSTEMI.  No prior anticoag noted.  0718 0337 HL 0.29, subthera; 800 un/hr 0718 1134 HL 0.19, subthera; 900 un/hr 0718 1515 heparin  drip stopped for procedure  Goal of Therapy:  Heparin  level 0.3-0.7 units/ml Monitor platelets by anticoagulation protocol: Yes   Plan:  --resume heparin  infusion rate at 1100 units/hr --check HL in 6 hours  --Daily CBC per protocol while on IV heparin   Allean Haas PharmD Clinical Pharmacist 02/17/2024

## 2024-02-17 NOTE — Plan of Care (Signed)
  Problem: Education: Goal: Understanding of cardiac disease, CV risk reduction, and recovery process will improve Outcome: Progressing   Problem: Activity: Goal: Ability to tolerate increased activity will improve Outcome: Progressing   Problem: Cardiac: Goal: Ability to achieve and maintain adequate cardiovascular perfusion will improve Outcome: Progressing   Problem: Coping: Goal: Level of anxiety will decrease Outcome: Progressing   Problem: Safety: Goal: Ability to remain free from injury will improve Outcome: Progressing   Problem: Skin Integrity: Goal: Risk for impaired skin integrity will decrease Outcome: Progressing

## 2024-02-17 NOTE — Interval H&P Note (Signed)
 History and Physical Interval Note:  02/17/2024 3:54 PM  Laura Cohen  has presented today for surgery, with the diagnosis of non-ST segment myocardial infarction.  The various methods of treatment have been discussed with the patient and family. After consideration of risks, benefits and other options for treatment, the patient has consented to  Procedure(s): LEFT HEART CATH AND CORONARY ANGIOGRAPHY (N/A) as a surgical intervention.  The patient's history has been reviewed, patient examined, no change in status, stable for surgery.  I have reviewed the patient's chart and labs.  Questions were answered to the patient's satisfaction.    Cath Lab Visit (complete for each Cath Lab visit)  Clinical Evaluation Leading to the Procedure:   ACS: Yes.    Non-ACS:  N/A  [name]

## 2024-02-17 NOTE — Plan of Care (Signed)
°  Problem: Activity: Goal: Ability to tolerate increased activity will improve Outcome: Progressing   Problem: Health Behavior/Discharge Planning: Goal: Ability to safely manage health-related needs after discharge will improve Outcome: Progressing   Problem: Education: Goal: Knowledge of General Education information will improve Description: Including pain rating scale, medication(s)/side effects and non-pharmacologic comfort measures Outcome: Progressing

## 2024-02-17 NOTE — TOC CM/SW Note (Signed)
 Transition of Care [facility]) - Inpatient Brief Assessment   Patient Details  Name: Laura Cohen MRN: [id number] Date of Birth: October 19, 1955  [facility] CM/SW Contact:    Laura JAYSON Carpen, LCSW Phone Number: 02/17/2024, 4:16 PM   Clinical Narrative: CSW reviewed chart. No TOC needs so far. CSW will continue to follow progress. Please place [facility] consult if any needs arise.  Transition of Care Asessment: Insurance and Status: Insurance coverage has been reviewed Patient has primary care physician: Yes Home environment has been reviewed: Single family home Prior level of function:: Not documented Prior/Current Home Services: No current home services Social Drivers of Health Review: SDOH reviewed no interventions necessary Readmission risk has been reviewed: Yes Transition of care needs: no transition of care needs at this time

## 2024-02-17 NOTE — Progress Notes (Signed)
 [facility] Note  Date: 02/17/24  Time: 5:45 PM  I was alerted by the patient's RN in specials/recovery that Laura Cohen had developed tachycardic rhythm similar to what was intermittently noted in the ED yesterday.  EKG shows a-tach versus atypical atrial flutter with variable block; telemetry with some irregularity; cannot exclude salvos of atrial fibrillation with rapid ventricular response.  Given her severely reduced LVEF in the setting of Takotsubo cardiomyopathy, I do not think that Laura Cohen will tolerate this degree of tachycardia well for an extended period.  She is asymptomatic at this time with low normal blood pressure.  We have agreed to initiate IV amiodarone to attempt rhythm control.  Hopefully, this will only be needed for a short duration, as she is concerned about a family member who experienced long-term amiodarone side-effect.  We will need to watch her QT-interval closely and continue avoiding other QT-prolonging medications.  Laura Cohen is in agreement with the plan.  Lonni Hanson, MD [facility]

## 2024-02-17 NOTE — Care Management Important Message (Signed)
 Important Message  Patient Details  Name: Laura Cohen MRN: [id number] Date of Birth: 27-Apr-1956   Important Message Given:  Yes - Medicare IM     [name] 02/17/2024, 12:08 PM

## 2024-02-18 DIAGNOSIS — I5181 Takotsubo syndrome: Secondary | ICD-10-CM | POA: Diagnosis not present

## 2024-02-18 DIAGNOSIS — I10 Essential (primary) hypertension: Secondary | ICD-10-CM

## 2024-02-18 DIAGNOSIS — I5021 Acute systolic (congestive) heart failure: Secondary | ICD-10-CM

## 2024-02-18 DIAGNOSIS — I214 Non-ST elevation (NSTEMI) myocardial infarction: Secondary | ICD-10-CM | POA: Diagnosis not present

## 2024-02-18 LAB — CBC
HCT: 38.5 % (ref 36.0–46.0)
Hemoglobin: 13.4 g/dL (ref 12.0–15.0)
MCH: 30.2 pg (ref 26.0–34.0)
MCHC: 34.8 g/dL (ref 30.0–36.0)
MCV: 86.9 fL (ref 80.0–100.0)
Platelets: 244 K/uL (ref 150–400)
RBC: 4.43 MIL/uL (ref 3.87–5.11)
RDW: 12.4 % (ref 11.5–15.5)
WBC: 13.6 K/uL — ABNORMAL HIGH (ref 4.0–10.5)
nRBC: 0 % (ref 0.0–0.2)

## 2024-02-18 LAB — BASIC METABOLIC PANEL WITH GFR
Anion gap: 10 (ref 5–15)
BUN: 6 mg/dL — ABNORMAL LOW (ref 8–23)
CO2: 20 mmol/L — ABNORMAL LOW (ref 22–32)
Calcium: 8.2 mg/dL — ABNORMAL LOW (ref 8.9–10.3)
Chloride: 106 mmol/L (ref 98–111)
Creatinine, Ser: 0.74 mg/dL (ref 0.44–1.00)
GFR, Estimated: >60 mL/min (ref >60)
Glucose, Bld: 118 mg/dL — ABNORMAL HIGH (ref 70–99)
Potassium: 4 mmol/L (ref 3.5–5.1)
Sodium: 136 mmol/L (ref 135–145)

## 2024-02-18 LAB — PHOSPHORUS: Phosphorus: 1.7 mg/dL — ABNORMAL LOW (ref 2.5–4.6)

## 2024-02-18 LAB — HEPARIN LEVEL (UNFRACTIONATED)
Heparin Unfractionated: 0.24 [IU]/mL — ABNORMAL LOW (ref 0.30–0.70)
Heparin Unfractionated: 0.4 [IU]/mL (ref 0.30–0.70)
Heparin Unfractionated: 0.26 [IU]/mL — ABNORMAL LOW (ref 0.30–0.70)
Heparin Unfractionated: 0.41 [IU]/mL (ref 0.30–0.70)

## 2024-02-18 LAB — MRSA NEXT GEN BY PCR, NASAL: MRSA by PCR Next Gen: NOT DETECTED

## 2024-02-18 LAB — MAGNESIUM: Magnesium: 2.7 mg/dL — ABNORMAL HIGH (ref 1.7–2.4)

## 2024-02-18 MED ORDER — ENSURE PLUS HIGH PROTEIN PO LIQD
237.0000 mL | Freq: Three times a day (TID) | ORAL | Status: DC
  Administered 2024-02-18 – 2024-02-19 (×2): 237 mL via ORAL

## 2024-02-18 MED ORDER — ROSUVASTATIN CALCIUM 5 MG PO TABS
5.0000 mg | ORAL_TABLET | Freq: Every day | ORAL | Status: DC
  Administered 2024-02-18 – 2024-02-20 (×3): 5 mg via ORAL
  Filled 2024-02-18 (×3): qty 1

## 2024-02-18 MED ORDER — HEPARIN BOLUS VIA INFUSION
1000.0000 [IU] | Freq: Once | INTRAVENOUS | Status: AC
  Administered 2024-02-18: 1000 [IU] via INTRAVENOUS
  Filled 2024-02-18: qty 1000

## 2024-02-18 MED ORDER — POTASSIUM PHOSPHATES 15 MMOLE/5ML IV SOLN
30.0000 mmol | Freq: Once | INTRAVENOUS | Status: AC
  Administered 2024-02-18: 30 mmol via INTRAVENOUS
  Filled 2024-02-18: qty 10

## 2024-02-18 NOTE — Progress Notes (Signed)
 [facility] Progress Note  Patient: Laura Cohen    FMW:[id number]  DOA: 02/16/2024     Date of Service: the patient was seen and examined on 02/18/2024  Chief Complaint  Patient presents with   Chest Pain   Brief hospital course: KATRIN GRABEL is a 68 y.o. female with medical history significant of hypertension, hyperlipidemia.  She presented to the emergency room with complaints of left-sided chest pain and back pain since last night.  Patient apparently had found her mother dead on the floor last night while doing a welfare check on her.  This shocked her extremely and reports having developed symptoms of back pain and chest tightness.  She denied any radiation to the jaw or arm.  Denied any associated exertional symptoms.  Pain has been constant.  She however complains of difficulty catching her breath.  She denies any previous history of coronary disease.   Workup in the ED revealed troponin greater than 1000.  EKG did show evidence of T wave inversions in inferior and lateral leads.     Assessment and Plan:  # Takotsubo cardiomyopathy.  Elevated troponin consistent with NSTEMI.   Troponin peaked at 1187.   - Heparin  IV infusion  - Aspirin  81 mg p.o. daily -Nitroglycerin  as needed -Lipitor 40 mg p.o. daily D-dimer negative, less likely PE BNP 870 elevated TTE: LVEF 30 to 35%, LV basilar hypokinesis with mild apical hypokinesis suggesting stress-induced cardiomyopathy.  LV wall motion abnormalities.  Moderate PAH, mild to moderate TR 7/18 s/p cardiac cath: No significant CAD, severely reduced LVEF. Cardiology recommended gentle diuresis and GDMT  # Atrial arrhythmias Atrial tachycardia versus atrial fibrillation developed after cardiac cath, now back to normal sinus rhythm. Patient received IV amiodarone  infusion after cath, discontinued on 7/19 Plan is to start beta-blocker when blood pressure improves Continue heparin  IV infusion, patient may need DOAC, TBD as per  cardio   # Hypertension: 7/19 currently patient is hypotensive Home med lisinopril /hydrochlorothiazide  held for now Consider adding beta-blockers due to ACS. Monitor BP and titrate medications accordingly   # Dyslipidemia: Crestor  5 mg p.o. daily started on 7/19 LDL 35, below goal 70-100    # Hypophosphatemia, Phos repleted. Monitor electrolytes and replete as needed.  # Leukocytosis, most likely reactive WBC count improving   Body mass index is 27.49 kg/m.  Interventions:  Diet: Heart healthy diet DVT Prophylaxis: Heparin  IV infusion  Advance goals of care discussion: Full code  Family Communication: family was present at bedside, at the time of interview.  The pt provided permission to discuss medical plan with the family. Opportunity was given to ask question and all questions were answered satisfactorily.   Disposition:  Pt is from home, admitted with non-STEMI, s/p cardiac, developed atrial arrhythmia, s/p amiodarone  IV infusion, still very hypotensive, which precludes a safe discharge. Discharge to home, when stable and cleared by cardiology, most likely tomorrow a.m.  Subjective: No significant events overnight, but after cath patient developed atrial arrhythmias and she was transferred to stepdown unit.  Patient did receive amiodarone  infusion.  In the morning time patient was feeling fine, denied any chest pain or palpitations, no shortness of breath. Patient wanted to go home but she is not stable so she will be monitored as per cardiology.  Plan is for discharge tomorrow a.m. if remains stable.   Physical Exam: General: NAD, lying comfortably Appear in no distress, affect appropriate Eyes: PERRLA ENT: Oral Mucosa Clear, moist  Neck: no JVD,  Cardiovascular: S1  and S2 Present, no Murmur,  Respiratory: good respiratory effort, Bilateral Air entry equal and Decreased, no Crackles, no wheezes Abdomen: Bowel Sound present, Soft and no tenderness,  Skin: no  rashes Extremities: no Pedal edema, no calf tenderness Neurologic: without any new focal findings Gait not checked due to patient safety concerns  Vitals:   02/18/24 1000 02/18/24 1100 02/18/24 1200 02/18/24 1300  BP: (!) 96/47 (!) 92/46 (!) 86/44 (!) 89/36  Pulse: 73 76 71 73  Resp: 16 19 19 18   Temp:   98.3 F (36.8 C)   TempSrc:   Oral   SpO2: 97% 97% 95% 96%  Weight:      Height:        Intake/Output Summary (Last 24 hours) at 02/18/2024 1327 Last data filed at 02/18/2024 1314 Gross per 24 hour  Intake 1111.48 ml  Output 2600 ml  Net -1488.52 ml   Filed Weights   02/17/24 1005 02/17/24 1445 02/17/24 1930  Weight: 69.6 kg 69.6 kg 70.4 kg    Data Reviewed: I have personally reviewed and interpreted daily labs, tele strips, imagings as discussed above. I reviewed all nursing notes, pharmacy notes, vitals, pertinent old records I have discussed plan of care as described above with RN and patient/family.  CBC: Recent Labs  Lab 02/16/24 2136 02/17/24 0337 02/18/24 0148  WBC 19.6* 15.6* 13.6*  HGB 13.8 12.8 13.4  HCT 39.9 37.0 38.5  MCV 88.1 87.1 86.9  PLT 288 252 244   Basic Metabolic Panel: Recent Labs  Lab 02/16/24 2136 02/17/24 0337 02/17/24 1820 02/18/24 0148  NA 131* 130* 132* 136  K 3.8 3.6 3.2* 4.0  CL 95* 98 98 106  CO2 24 23 21* 20*  GLUCOSE 132* 122* 175* 118*  BUN 13 11 7* 6*  CREATININE 0.59 0.48 0.63 0.74  CALCIUM  8.9 8.4* 8.4* 8.2*  MG  --  1.8 1.7 2.7*  PHOS  --  2.6  --  1.7*    Studies: CARDIAC CATHETERIZATION Result Date: 02/17/2024 Conclusions: No angiographically significant coronary artery disease.  Given severely reduced LVEF by echo with mid and apical wall-motion abnormality, findings are consistent with Takotsubo cardiomyopathy. Moderately elevated left ventricular filling pressure (LVEDP 27 mmHg). Recommendations: Gentle diuresis and initiation of goal-directed medical therapy. Primary prevention of coronary artery disease.  Lonni Hanson, MD [facility]  ECHOCARDIOGRAM COMPLETE Result Date: 02/17/2024    ECHOCARDIOGRAM REPORT   Patient Name:   NAZARETH NORENBERG Date of Exam: 02/17/2024 Medical Rec #:  [id number]      Height:       63.0 in Accession #:    [id number]     Weight:       153.4 lb Date of Birth:  05-13-1956      BSA:          1.727 m Patient Age:    67 years       BP:           129/65 mmHg Patient Gender: F              HR:           97 bpm. Exam Location:  [facility] Procedure: 2D Echo, Cardiac Doppler, Color Doppler and Strain Analysis (Both            Spectral and Color Flow Doppler were utilized during procedure). Indications:     NSTEMI I21.4  History:         Patient has prior history of Echocardiogram examinations,  most                  recent 10/21/2021. Abnormal ECG.  Sonographer:     Rosina Dunk Referring Phys:  [id number] MAUDE MARLA DART Diagnosing Phys: Redell Cave MD IMPRESSIONS  1. There is LV basilar hyperkinesis with mid-apical hypokinesis suggesting stress-induced cardiomyopathy. Left ventricular ejection fraction, by estimation, is 30 to 35%. The left ventricle has moderate to severely decreased function. The left ventricle  demonstrates regional wall motion abnormalities (see scoring diagram/findings for description). Left ventricular diastolic parameters are indeterminate.  2. Right ventricular systolic function is normal. The right ventricular size is normal. There is moderately elevated pulmonary artery systolic pressure.  3. The mitral valve is normal in structure. Mild mitral valve regurgitation.  4. Tricuspid valve regurgitation is mild to moderate.  5. The aortic valve is tricuspid. Aortic valve regurgitation is not visualized.  6. The inferior vena cava is normal in size with <50% respiratory variability, suggesting right atrial pressure of 8 mmHg. FINDINGS  Left Ventricle: There is LV basilar hyperkinesis with mid-apical hypokinesis suggesting stress-induced cardiomyopathy. Left  ventricular ejection fraction, by estimation, is 30 to 35%. The left ventricle has moderate to severely decreased function. The left ventricle demonstrates regional wall motion abnormalities. Definity  contrast agent was given IV to delineate the left ventricular endocardial borders. The left ventricular internal cavity size was normal in size. There is no left ventricular hypertrophy. Left ventricular diastolic parameters are indeterminate. Right Ventricle: The right ventricular size is normal. No increase in right ventricular wall thickness. Right ventricular systolic function is normal. There is moderately elevated pulmonary artery systolic pressure. The tricuspid regurgitant velocity is 3.17 m/s, and with an assumed right atrial pressure of 8 mmHg, the estimated right ventricular systolic pressure is 48.2 mmHg. Left Atrium: Left atrial size was normal in size. Right Atrium: Right atrial size was normal in size. Pericardium: There is no evidence of pericardial effusion. Mitral Valve: The mitral valve is normal in structure. Mild mitral valve regurgitation. MV peak gradient, 3.2 mmHg. The mean mitral valve gradient is 2.0 mmHg. Tricuspid Valve: The tricuspid valve is normal in structure. Tricuspid valve regurgitation is mild to moderate. Aortic Valve: The aortic valve is tricuspid. Aortic valve regurgitation is not visualized. Aortic valve mean gradient measures 3.0 mmHg. Aortic valve peak gradient measures 4.6 mmHg. Aortic valve area, by VTI measures 2.41 cm. Pulmonic Valve: The pulmonic valve was normal in structure. Pulmonic valve regurgitation is mild. Aorta: The aortic root and ascending aorta are structurally normal, with no evidence of dilitation. Venous: The inferior vena cava was not well visualized. The inferior vena cava is normal in size with less than 50% respiratory variability, suggesting right atrial pressure of 8 mmHg. IAS/Shunts: No atrial level shunt detected by color flow Doppler.  LEFT  VENTRICLE PLAX 2D LVIDd:         4.30 cm     Diastology LVIDs:         2.70 cm     LV e' medial:    6.64 cm/s LV PW:         1.10 cm     LV E/e' medial:  11.6 LV IVS:        0.80 cm     LV e' lateral:   9.25 cm/s LVOT diam:     1.90 cm     LV E/e' lateral: 8.3 LV SV:         40 LV SV Index:   23 LVOT  Area:     2.84 cm  LV Volumes (MOD) LV vol d, MOD A2C: 80.7 ml LV vol d, MOD A4C: 78.0 ml LV vol s, MOD A2C: 46.3 ml LV vol s, MOD A4C: 42.5 ml LV SV MOD A2C:     34.4 ml LV SV MOD A4C:     78.0 ml LV SV MOD BP:      36.1 ml RIGHT VENTRICLE RV Basal diam:  3.00 cm RV Mid diam:    2.20 cm RV S prime:     18.00 cm/s TAPSE (M-mode): 2.2 cm LEFT ATRIUM             Index        RIGHT ATRIUM           Index LA diam:        3.50 cm 2.03 cm/m   RA Area:     10.30 cm LA Vol (A2C):   43.4 ml 25.12 ml/m  RA Volume:   19.00 ml  11.00 ml/m LA Vol (A4C):   13.1 ml 7.58 ml/m LA Biplane Vol: 26.3 ml 15.22 ml/m  AORTIC VALVE                    PULMONIC VALVE AV Area (Vmax):    2.24 cm     PV Vmax:        1.05 m/s AV Area (Vmean):   2.21 cm     PV Vmean:       67.800 cm/s AV Area (VTI):     2.41 cm     PV VTI:         0.180 m AV Vmax:           107.00 cm/s  PV Peak grad:   4.4 mmHg AV Vmean:          73.100 cm/s  PV Mean grad:   2.0 mmHg AV VTI:            0.167 m      RVOT Peak grad: 2 mmHg AV Peak Grad:      4.6 mmHg AV Mean Grad:      3.0 mmHg LVOT Vmax:         84.40 cm/s LVOT Vmean:        57.100 cm/s LVOT VTI:          0.142 m LVOT/AV VTI ratio: 0.85  AORTA Ao Root diam: 2.80 cm Ao Asc diam:  2.90 cm MITRAL VALVE               TRICUSPID VALVE MV Area (PHT): 4.31 cm    TR Peak grad:   40.2 mmHg MV Area VTI:   1.75 cm    TR Mean grad:   30.0 mmHg MV Peak grad:  3.2 mmHg    TR Vmax:        317.00 cm/s MV Mean grad:  2.0 mmHg    TR Vmean:       270.0 cm/s MV Vmax:       0.89 m/s MV Vmean:      60.3 cm/s   SHUNTS MV Decel Time: 176 msec    Systemic VTI:  0.14 m MV E velocity: 77.00 cm/s  Systemic Diam: 1.90 cm MV A velocity:  74.40 cm/s  Pulmonic VTI:  0.133 m MV E/A ratio:  1.03 Redell Cave MD Electronically signed by Redell Cave MD Signature Date/Time: 02/17/2024/4:36:11 PM    Final     Scheduled Meds:  aspirin  EC  81 mg Oral Daily   feeding supplement  237 mL Oral TID BM   rosuvastatin   5 mg Oral Daily   sodium chloride  flush  3 mL Intravenous Q12H   Continuous Infusions:  sodium chloride      heparin  1,250 Units/hr (02/18/24 1314)   potassium PHOSPHATE  IVPB (in mmol) 85 mL/hr at 02/18/24 1314   PRN Meds: sodium chloride , acetaminophen , acetaminophen , nitroGLYCERIN , sodium chloride  flush  Time spent: 55 minutes  Author: ELVAN SOR. MD [facility] 02/18/2024 1:27 PM  To reach On-call, see care teams to locate the attending and reach out to them via [email or web address]. If 7PM-7AM, please contact night-coverage If you still have difficulty reaching the attending provider, please page the [facility] (Director on Call) for [facility] on amion for assistance.

## 2024-02-18 NOTE — Progress Notes (Signed)
 PHARMACY - ANTICOAGULATION CONSULT NOTE  Pharmacy Consult for IV Heparin   Indication: chest pain/ACS  Patient Measurements: Height: 5' 3 (160 cm) Weight: 70.4 kg (155 lb 3.3 oz) IBW/kg (Calculated) : 52.4 HEPARIN  DW (KG): 67  Labs: Recent Labs    02/16/24 2136 02/16/24 2247 02/16/24 2317 02/17/24 0337 02/17/24 1134 02/17/24 1820 02/18/24 0148 02/18/24 0850 02/18/24 1448  HGB 13.8  --   --  12.8  --   --  13.4  --   --   HCT 39.9  --   --  37.0  --   --  38.5  --   --   PLT 288  --   --  252  --   --  244  --   --   APTT  --  30  --   --   --   --   --   --   --   HEPARINUNFRC  --   --   --  0.29*   < >  --  0.24* 0.40 0.26*  CREATININE 0.59  --   --  0.48  --  0.63 0.74  --   --   TROPONINIHS 1,187*  --  911*  --   --   --   --   --   --    < > = values in this interval not displayed.    Estimated Creatinine Clearance: 64.2 mL/min (by C-G formula based on SCr of 0.74 mg/dL).   Medical History: Past Medical History:  Diagnosis Date   Hematuria    Hemorrhoids    Hyperlipidemia    Hypertension    Insomnia    Menopausal symptoms    Osteopenia    Assessment: Pharmacy consulted to dose heparin  in this 68 year old female admitted with ACS/NSTEMI.  No prior anticoag noted.  0718 0337 HL 0.29, subthera; 800 un/hr 0718 1134 HL 0.19, subthera; 900 un/hr 0718 1515 heparin  drip stopped for procedure 0719 0148 HL 0.24, SUBtherapeutic 1100 units/hr 0719 0850 HL 0.4  0719 1520 HL 0.26  Goal of Therapy:  Heparin  level 0.3-0.7 units/ml Monitor platelets by anticoagulation protocol: Yes   Plan:  Heparin  level is subtherapeutic. Will give heparin  bolus of 1000 units x1 and increase heparin  infusion to 1350 units/hr. Recheck heparin  level 6 hours. CBC daily while on heparin .   Laura Cohen, PharmD, BCPS Clinical Pharmacist 02/18/2024

## 2024-02-18 NOTE — Progress Notes (Signed)
 Patient transferred to 69 by this RN. Vitals WNL at time of transfer. Receiving RN Dwayne in room at time of arrival. All personal belongings sent with patient packed by patient's husband.

## 2024-02-18 NOTE — Progress Notes (Addendum)
 Patient ambulated around ICU unit 1x with RN at her side. All vitals within patient's expected parameters. Patient returned to room, all belongings including call light within reach. Patient requested to return to bed and not recliner at this time. All PIVs and equipment in proper place and functioning at this time. Offered to assist patient with additional ambulation when she feels ready. Will continue to monitor closely.

## 2024-02-18 NOTE — Progress Notes (Signed)
 PHARMACY - ANTICOAGULATION CONSULT NOTE  Pharmacy Consult for IV Heparin   Indication: chest pain/ACS  Patient Measurements: Height: 5' 3 (160 cm) Weight: 70.4 kg (155 lb 3.3 oz) IBW/kg (Calculated) : 52.4 HEPARIN  DW (KG): 67  Labs: Recent Labs    02/16/24 2136 02/16/24 2136 02/16/24 2247 02/16/24 2317 02/17/24 0337 02/17/24 1134 02/17/24 1820 02/18/24 0148 02/18/24 0850  HGB 13.8  --   --   --  12.8  --   --  13.4  --   HCT 39.9  --   --   --  37.0  --   --  38.5  --   PLT 288  --   --   --  252  --   --  244  --   APTT  --   --  30  --   --   --   --   --   --   HEPARINUNFRC  --    < >  --   --  0.29* 0.19*  --  0.24* 0.40  CREATININE 0.59  --   --   --  0.48  --  0.63 0.74  --   TROPONINIHS 1,187*  --   --  911*  --   --   --   --   --    < > = values in this interval not displayed.    Estimated Creatinine Clearance: 64.2 mL/min (by C-G formula based on SCr of 0.74 mg/dL).   Medical History: Past Medical History:  Diagnosis Date   Hematuria    Hemorrhoids    Hyperlipidemia    Hypertension    Insomnia    Menopausal symptoms    Osteopenia    Assessment: Pharmacy consulted to dose heparin  in this 68 year old female admitted with ACS/NSTEMI.  No prior anticoag noted.  0718 0337 HL 0.29, subthera; 800 un/hr 0718 1134 HL 0.19, subthera; 900 un/hr 0718 1515 heparin  drip stopped for procedure 0719 0148 HL 0.24, SUBtherapeutic 1100 units/hr 0719 0850 HL 0.4   Goal of Therapy:  Heparin  level 0.3-0.7 units/ml Monitor platelets by anticoagulation protocol: Yes   Plan:  Heparin  level is therapeutic. Will continue heparin  infusion 1250 units/hr. Recheck heparin  level 6 hours. CBC daily while on heparin .   Cathaleen GORMAN Blanch, PharmD, BCPS Clinical Pharmacist 02/18/2024

## 2024-02-18 NOTE — Progress Notes (Signed)
 PHARMACY - ANTICOAGULATION CONSULT NOTE  Pharmacy Consult for IV Heparin   Indication: chest pain/ACS  Patient Measurements: Height: 5' 3 (160 cm) Weight: 70.4 kg (155 lb 3.3 oz) IBW/kg (Calculated) : 52.4 HEPARIN  DW (KG): 67  Labs: Recent Labs    02/16/24 2136 02/16/24 2247 02/16/24 2317 02/17/24 0337 02/17/24 1134 02/17/24 1820 02/18/24 0148  HGB 13.8  --   --  12.8  --   --  13.4  HCT 39.9  --   --  37.0  --   --  38.5  PLT 288  --   --  252  --   --  244  APTT  --  30  --   --   --   --   --   HEPARINUNFRC  --   --   --  0.29* 0.19*  --  0.24*  CREATININE 0.59  --   --  0.48  --  0.63 0.74  TROPONINIHS 1,187*  --  911*  --   --   --   --     Estimated Creatinine Clearance: 64.2 mL/min (by C-G formula based on SCr of 0.74 mg/dL).   Medical History: Past Medical History:  Diagnosis Date   Hematuria    Hemorrhoids    Hyperlipidemia    Hypertension    Insomnia    Menopausal symptoms    Osteopenia    Assessment: Pharmacy consulted to dose heparin  in this 68 year old female admitted with ACS/NSTEMI.  No prior anticoag noted.  0718 0337 HL 0.29, subthera; 800 un/hr 0718 1134 HL 0.19, subthera; 900 un/hr 0718 1515 heparin  drip stopped for procedure 0719 0148 HL 0.24, SUBtherapeutic 1100 units/hr  Goal of Therapy:  Heparin  level 0.3-0.7 units/ml Monitor platelets by anticoagulation protocol: Yes   Plan:  7/19:  HL @ 0148 = 0.24, SUBtherapeutic - will order heparin  1000 units IV X 1 bolus and increased drip rate to 1250 units/hr - recheck HL 6 hrs after rate change  --Daily CBC per protocol while on IV heparin   Carmelite Violet D Clinical Pharmacist 02/18/2024

## 2024-02-18 NOTE — Plan of Care (Signed)
  Problem: Education: Goal: Understanding of cardiac disease, CV risk reduction, and recovery process will improve Outcome: Progressing   Problem: Activity: Goal: Ability to tolerate increased activity will improve Outcome: Progressing   Problem: Cardiac: Goal: Ability to achieve and maintain adequate cardiovascular perfusion will improve Outcome: Progressing   Problem: Education: Goal: Knowledge of General Education information will improve Description: Including pain rating scale, medication(s)/side effects and non-pharmacologic comfort measures Outcome: Progressing   Problem: Health Behavior/Discharge Planning: Goal: Ability to manage health-related needs will improve Outcome: Progressing   Problem: Clinical Measurements: Goal: Ability to maintain clinical measurements within normal limits will improve Outcome: Progressing Goal: Will remain free from infection Outcome: Progressing Goal: Diagnostic test results will improve Outcome: Progressing   Problem: Activity: Goal: Risk for activity intolerance will decrease Outcome: Progressing   Problem: Nutrition: Goal: Adequate nutrition will be maintained Outcome: Progressing   Problem: Elimination: Goal: Will not experience complications related to bowel motility Outcome: Progressing Goal: Will not experience complications related to urinary retention Outcome: Progressing   Problem: Pain Managment: Goal: General experience of comfort will improve and/or be controlled Outcome: Progressing   Problem: Safety: Goal: Ability to remain free from injury will improve Outcome: Progressing   Problem: Skin Integrity: Goal: Risk for impaired skin integrity will decrease Outcome: Progressing   Problem: Clinical Measurements: Goal: Cardiovascular complication will be avoided Outcome: Not Progressing   Problem: Coping: Goal: Level of anxiety will decrease Outcome: Not Progressing   Problem: Clinical Measurements: Goal:  Respiratory complications will improve Outcome: Not Applicable

## 2024-02-18 NOTE — Progress Notes (Signed)
 Progress Note  Patient Name: Laura Cohen Date of Encounter: 02/18/2024  Primary Cardiologist: Jeffrie  Subjective   Admitted with Takotsubo cardiomyopathy with LHC on 7/18 showing no evidence of angiographically significant coronary artery disease.  Postprocedure she developed a tachycardic rhythm similar to what was intermittently noted in the ED with EKG showing atrial tachycardia versus atypical atrial flutter with variable AV block and inability to exclude some salvos of atrial fibrillation with RVR.  Hypotension precluded addition of beta-blocker.  She was started on IV amiodarone  to attempt rhythm control.  Feels much better this morning.  No further chest pressure.  Dyspnea resolved. No palpitations or further tachycardic rhythms.  No right radial arteriotomy site complications.  BP soft predominantly in the 80s to 90s systolic.  Hyponatremia and hypokalemia resolved.  Documented urine output 872 mL with IV Lasix  20 mg following LHC.  Inpatient Medications    Scheduled Meds:  aspirin  EC  81 mg Oral Daily   furosemide   20 mg Intravenous Daily   losartan   12.5 mg Oral Daily   sodium chloride  flush  3 mL Intravenous Q12H   Continuous Infusions:  sodium chloride      amiodarone  30 mg/hr (02/18/24 0700)   heparin  1,250 Units/hr (02/18/24 0700)   PRN Meds: sodium chloride , acetaminophen , acetaminophen , nitroGLYCERIN , sodium chloride  flush   Vital Signs    Vitals:   02/18/24 0400 02/18/24 0500 02/18/24 0600 02/18/24 0700  BP: (!) 95/54 (!) 84/55 (!) 94/54 (!) 95/54  Pulse: 68 70 72 72  Resp: 20 (!) 21 13 (!) 21  Temp: 98.6 F (37 C)     TempSrc: Oral     SpO2: 96% 98% 97% 96%  Weight:      Height:        Intake/Output Summary (Last 24 hours) at 02/18/2024 0709 Last data filed at 02/18/2024 0700 Gross per 24 hour  Intake 818.08 ml  Output 1800 ml  Net -981.92 ml   Filed Weights   02/17/24 1005 02/17/24 1445 02/17/24 1930  Weight: 69.6 kg 69.6 kg 70.4 kg     Telemetry    SR - Personally Reviewed  ECG    Atrial tachycardia versus atypical atrial flutter with variable AV block, 161 bpm, global ST-T changes - Personally Reviewed  Physical Exam   GEN: No acute distress.   Neck: No JVD. Cardiac: RRR, no murmurs, rubs, or gallops.  Right radial arteriotomy site without bleeding, bruising, swelling, erythema, warmth, or TTP.  Radial pulse 2+ proximal and distal to the arteriotomy site.  Respiratory: Clear to auscultation bilaterally.  GI: Soft, nontender, non-distended.   MS: No edema; No deformity. Neuro:  Alert and oriented x 3; Nonfocal.  Psych: Normal affect.  Labs    Chemistry Recent Labs  Lab 02/17/24 0337 02/17/24 1820 02/18/24 0148  NA 130* 132* 136  K 3.6 3.2* 4.0  CL 98 98 106  CO2 23 21* 20*  GLUCOSE 122* 175* 118*  BUN 11 7* 6*  CREATININE 0.48 0.63 0.74  CALCIUM  8.4* 8.4* 8.2*  GFRNONAA >60 >60 >60  ANIONGAP 9 13 10      Hematology Recent Labs  Lab 02/16/24 2136 02/17/24 0337 02/18/24 0148  WBC 19.6* 15.6* 13.6*  RBC 4.53 4.25 4.43  HGB 13.8 12.8 13.4  HCT 39.9 37.0 38.5  MCV 88.1 87.1 86.9  MCH 30.5 30.1 30.2  MCHC 34.6 34.6 34.8  RDW 12.4 12.3 12.4  PLT 288 252 244    Cardiac EnzymesNo results for input(s): TROPONINI  in the last 168 hours. No results for input(s): TROPIPOC in the last 168 hours.   BNP Recent Labs  Lab 02/17/24 0337  BNP 870.3*     DDimer  Recent Labs  Lab 02/17/24 0127  DDIMER <0.27     Radiology    DG Chest 2 View Result Date: 02/16/2024 IMPRESSION: No active cardiopulmonary disease. Electronically Signed   By: Oneil Devonshire M.D.   On: 02/16/2024 21:59    Cardiac Studies   LHC 02/17/2024: Conclusions: No angiographically significant coronary artery disease.  Given severely reduced LVEF by echo with mid and apical wall-motion abnormality, findings are consistent with Takotsubo cardiomyopathy. Moderately elevated left ventricular filling pressure (LVEDP 27  mmHg).   Recommendations: Gentle diuresis and initiation of goal-directed medical therapy. Primary prevention of coronary artery disease. __________  2D echo 02/17/2024: 1. There is LV basilar hyperkinesis with mid-apical hypokinesis  suggesting stress-induced cardiomyopathy. Left ventricular ejection  fraction, by estimation, is 30 to 35%. The left ventricle has moderate to  severely decreased function. The left ventricle   demonstrates regional wall motion abnormalities (see scoring  diagram/findings for description). Left ventricular diastolic parameters  are indeterminate.   2. Right ventricular systolic function is normal. The right ventricular  size is normal. There is moderately elevated pulmonary artery systolic  pressure.   3. The mitral valve is normal in structure. Mild mitral valve  regurgitation.   4. Tricuspid valve regurgitation is mild to moderate.   5. The aortic valve is tricuspid. Aortic valve regurgitation is not  visualized.   6. The inferior vena cava is normal in size with <50% respiratory  variability, suggesting right atrial pressure of 8 mmHg.   Patient Profile     68 y.o. female with history of palpitations with atrial tachycardia, HTN, HLD, and remote brief tobacco use who is being seen today for the evaluation of Takotsubo cardiomyopathy at the request of Dr. Marca.   Assessment & Plan    1.  Takotsubo cardiomyopathy: - In the setting of her mother's passing - BP soft, precluding escalation of GDMT - Will stop IV amiodarone  as below given no further tachycardic rhythms, hopefully this will allow for the continuation of losartan  - Gentle IV diuresis as tolerated - Will need follow-up echo in the outpatient setting following optimization of GDMT - If EF remains less than 35% will need cardiac MRI and EP evaluation - Post-cath instructions - She is hopeful for discharge today - Her mother's funeral is today in [place], though she does not plan  to attend, even if she were discharged today - Will discuss with MD  2. Atrial arrhythmia: - Concerning for atrial tachycardia vs atypical atrial flutter; unable to exclude short runs of Afib with RVR - No further episodes on IV amiodarone , which will be stopped this morning  - Monitor for recurrence  - Hypotension precludes addition of beta-blocker - Remains on heparin  drip for now  3.  HTN: - Blood pressure soft on IV amiodarone  and following addition of low-dose losartan  - Hold IV amiodarone  as above  4.  HLD: - LDL 35 - Given no evidence of CAD on LHC will reduce rosuvastatin  back to PTA dose of 5 mg  5.  Leukocytosis: - No obvious infection - Likely reactionary in the setting of the above  6.  Hypokalemia: - Repleted  7.  Hyponatremia: - Resolved       For questions or updates, please contact [facility] Please consult [email or web address] for contact info under  Cardiology/STEMI.    Signed, Bernardino Bring, PA-C [facility] Pager: [phone] 02/18/2024, 7:09 AM

## 2024-02-19 DIAGNOSIS — I471 Supraventricular tachycardia, unspecified: Secondary | ICD-10-CM

## 2024-02-19 DIAGNOSIS — I5181 Takotsubo syndrome: Secondary | ICD-10-CM

## 2024-02-19 DIAGNOSIS — I4581 Long QT syndrome: Secondary | ICD-10-CM

## 2024-02-19 DIAGNOSIS — R002 Palpitations: Secondary | ICD-10-CM

## 2024-02-19 DIAGNOSIS — I214 Non-ST elevation (NSTEMI) myocardial infarction: Secondary | ICD-10-CM | POA: Diagnosis not present

## 2024-02-19 LAB — CBC
HCT: 39.2 % (ref 36.0–46.0)
Hemoglobin: 13.5 g/dL (ref 12.0–15.0)
MCH: 30.6 pg (ref 26.0–34.0)
MCHC: 34.4 g/dL (ref 30.0–36.0)
MCV: 88.9 fL (ref 80.0–100.0)
Platelets: 236 K/uL (ref 150–400)
RBC: 4.41 MIL/uL (ref 3.87–5.11)
RDW: 12.8 % (ref 11.5–15.5)
WBC: 11.3 K/uL — ABNORMAL HIGH (ref 4.0–10.5)
nRBC: 0 % (ref 0.0–0.2)

## 2024-02-19 LAB — PHOSPHORUS: Phosphorus: 2.6 mg/dL (ref 2.5–4.6)

## 2024-02-19 LAB — BASIC METABOLIC PANEL WITH GFR
Anion gap: 10 (ref 5–15)
BUN: 12 mg/dL (ref 8–23)
CO2: 21 mmol/L — ABNORMAL LOW (ref 22–32)
Calcium: 8.9 mg/dL (ref 8.9–10.3)
Chloride: 106 mmol/L (ref 98–111)
Creatinine, Ser: 0.56 mg/dL (ref 0.44–1.00)
GFR, Estimated: >60 mL/min (ref >60)
Glucose, Bld: 103 mg/dL — ABNORMAL HIGH (ref 70–99)
Potassium: 4.1 mmol/L (ref 3.5–5.1)
Sodium: 137 mmol/L (ref 135–145)

## 2024-02-19 LAB — MAGNESIUM: Magnesium: 2.2 mg/dL (ref 1.7–2.4)

## 2024-02-19 LAB — HEPARIN LEVEL (UNFRACTIONATED): Heparin Unfractionated: 0.28 [IU]/mL — ABNORMAL LOW (ref 0.30–0.70)

## 2024-02-19 MED ORDER — HEPARIN BOLUS VIA INFUSION
1000.0000 [IU] | Freq: Once | INTRAVENOUS | Status: AC
  Administered 2024-02-19: 1000 [IU] via INTRAVENOUS
  Filled 2024-02-19: qty 1000

## 2024-02-19 MED ORDER — ENOXAPARIN SODIUM 40 MG/0.4ML IJ SOSY
40.0000 mg | PREFILLED_SYRINGE | INTRAMUSCULAR | Status: DC
  Administered 2024-02-19 – 2024-02-20 (×2): 40 mg via SUBCUTANEOUS
  Filled 2024-02-19 (×2): qty 0.4

## 2024-02-19 MED ORDER — METOPROLOL SUCCINATE ER 50 MG PO TB24: 50.0000 mg | ORAL_TABLET | Freq: Every day | ORAL | Status: DC

## 2024-02-19 MED ORDER — ENOXAPARIN SODIUM 30 MG/0.3ML IJ SOSY: 30.0000 mg | PREFILLED_SYRINGE | Freq: Two times a day (BID) | INTRAMUSCULAR | Status: DC

## 2024-02-19 MED ORDER — METOPROLOL SUCCINATE ER 25 MG PO TB24
12.5000 mg | ORAL_TABLET | Freq: Every day | ORAL | Status: DC
  Administered 2024-02-20: 12.5 mg via ORAL
  Filled 2024-02-19: qty 1

## 2024-02-19 MED ORDER — AMIODARONE LOAD VIA INFUSION
150.0000 mg | Freq: Once | INTRAVENOUS | Status: DC
  Filled 2024-02-19: qty 83.34

## 2024-02-19 MED ORDER — AMIODARONE HCL IN DEXTROSE 360-4.14 MG/200ML-% IV SOLN
30.0000 mg/h | INTRAVENOUS | Status: DC
  Administered 2024-02-20: 30 mg/h via INTRAVENOUS
  Filled 2024-02-19: qty 200

## 2024-02-19 MED ORDER — AMIODARONE HCL IN DEXTROSE 360-4.14 MG/200ML-% IV SOLN
60.0000 mg/h | INTRAVENOUS | Status: DC
  Administered 2024-02-19 (×2): 60 mg/h via INTRAVENOUS
  Filled 2024-02-19: qty 200

## 2024-02-19 NOTE — Progress Notes (Signed)
 Received a call from CCMD, patient had 11 beats of Vtach,  in and out of tachycardia  frequently, MD and the cardiology made aware of, started on Amiodarone  drip at 60mg /hr.

## 2024-02-19 NOTE — Progress Notes (Signed)
 Progress Note  Patient Name: Laura Cohen Date of Encounter: 02/19/2024  Primary Cardiologist: Jeffrie  Subjective   Feels better today. More strength. No chest pain, dyspnea, palpitations, dizziness, presyncope, or syncope. Documented UOP 1 L for the admission. Hopeful for discharge today.   Inpatient Medications    Scheduled Meds:  aspirin  EC  81 mg Oral Daily   feeding supplement  237 mL Oral TID BM   rosuvastatin   5 mg Oral Daily   sodium chloride  flush  3 mL Intravenous Q12H   Continuous Infusions:  heparin  1,450 Units/hr (02/19/24 0648)   PRN Meds: acetaminophen , acetaminophen , nitroGLYCERIN , sodium chloride  flush   Vital Signs    Vitals:   02/19/24 0021 02/19/24 0429 02/19/24 0447 02/19/24 0645  BP: (!) 97/48 (!) 105/48 (!) 103/50 (!) 120/56  Pulse: 73  78   Resp: 16 18 16 20   Temp: 99.4 F (37.4 C) 98.8 F (37.1 C) 98.9 F (37.2 C) 98.3 F (36.8 C)  TempSrc: Oral  Oral Oral  SpO2: 96% 97% 95% 98%  Weight:      Height:        Intake/Output Summary (Last 24 hours) at 02/19/2024 0723 Last data filed at 02/19/2024 0647 Gross per 24 hour  Intake 630.61 ml  Output 800 ml  Net -169.39 ml   Filed Weights   02/17/24 1005 02/17/24 1445 02/17/24 1930  Weight: 69.6 kg 69.6 kg 70.4 kg    Telemetry    SR with PACs, 2 runs of atrial tachycardia lasting 8.5 and 15 seconds - Personally Reviewed  ECG    No new tracings - Personally Reviewed  Physical Exam   GEN: No acute distress.   Neck: No JVD. Cardiac: RRR, no murmurs, rubs, or gallops.  Right radial arteriotomy site without bleeding, bruising, swelling, erythema, warmth, or TTP.  Radial pulse 2+ proximal and distal to the arteriotomy site.  Respiratory: Clear to auscultation bilaterally.  GI: Soft, nontender, non-distended.   MS: No edema; No deformity. Neuro:  Alert and oriented x 3; Nonfocal.  Psych: Normal affect.  Labs    Chemistry Recent Labs  Lab 02/17/24 1820 02/18/24 0148  02/19/24 0549  NA 132* 136 137  K 3.2* 4.0 4.1  CL 98 106 106  CO2 21* 20* 21*  GLUCOSE 175* 118* 103*  BUN 7* 6* 12  CREATININE 0.63 0.74 0.56  CALCIUM  8.4* 8.2* 8.9  GFRNONAA >60 >60 >60  ANIONGAP 13 10 10      Hematology Recent Labs  Lab 02/17/24 0337 02/18/24 0148 02/19/24 0549  WBC 15.6* 13.6* 11.3*  RBC 4.25 4.43 4.41  HGB 12.8 13.4 13.5  HCT 37.0 38.5 39.2  MCV 87.1 86.9 88.9  MCH 30.1 30.2 30.6  MCHC 34.6 34.8 34.4  RDW 12.3 12.4 12.8  PLT 252 244 236    Cardiac EnzymesNo results for input(s): TROPONINI in the last 168 hours. No results for input(s): TROPIPOC in the last 168 hours.   BNP Recent Labs  Lab 02/17/24 0337  BNP 870.3*     DDimer  Recent Labs  Lab 02/17/24 0127  DDIMER <0.27     Radiology    DG Chest 2 View Result Date: 02/16/2024 IMPRESSION: No active cardiopulmonary disease. Electronically Signed   By: Oneil Devonshire M.D.   On: 02/16/2024 21:59    Cardiac Studies   LHC 02/17/2024: Conclusions: No angiographically significant coronary artery disease.  Given severely reduced LVEF by echo with mid and apical wall-motion abnormality, findings are consistent with  Takotsubo cardiomyopathy. Moderately elevated left ventricular filling pressure (LVEDP 27 mmHg).   Recommendations: Gentle diuresis and initiation of goal-directed medical therapy. Primary prevention of coronary artery disease. __________  2D echo 02/17/2024: 1. There is LV basilar hyperkinesis with mid-apical hypokinesis  suggesting stress-induced cardiomyopathy. Left ventricular ejection  fraction, by estimation, is 30 to 35%. The left ventricle has moderate to  severely decreased function. The left ventricle   demonstrates regional wall motion abnormalities (see scoring  diagram/findings for description). Left ventricular diastolic parameters  are indeterminate.   2. Right ventricular systolic function is normal. The right ventricular  size is normal. There is  moderately elevated pulmonary artery systolic  pressure.   3. The mitral valve is normal in structure. Mild mitral valve  regurgitation.   4. Tricuspid valve regurgitation is mild to moderate.   5. The aortic valve is tricuspid. Aortic valve regurgitation is not  visualized.   6. The inferior vena cava is normal in size with <50% respiratory  variability, suggesting right atrial pressure of 8 mmHg.   Patient Profile     68 y.o. female with history of palpitations with atrial tachycardia, HTN, HLD, and remote brief tobacco use who is being seen today for the evaluation of Takotsubo cardiomyopathy at the request of Dr. Marca.   Assessment & Plan    1.  Takotsubo cardiomyopathy: - In the setting of her mother's passing - BP soft, precluding escalation of GDMT, leading to the discontinuation of ARB - Price of SGLT2i of $420 - Will need follow-up echo in the outpatient setting following optimization of GDMT in 3 months - If EF remains less than 35% will need cardiac MRI and EP evaluation - Post-cath instructions  2. Atrial arrhythmia: - Concerning for atrial tachycardia vs atypical atrial flutter; unable to exclude short runs of Afib with RVR - Consider adding low-dose beta blocker if BP remains stable prior to discharge, will discuss with MD - Monitor for recurrence  - Will arrange for Zio patch and follow up through the office  - Remains on heparin  drip for now, defer OAC for now pending Zio patch  3.  HTN: - Blood pressure improving (previously soft) off IV amiodarone   - Continue to hold PTA lisinopril  for now  4.  HLD: - LDL 35 - Given no evidence of CAD on LHC will Crestor  was reduced back to PTA dose of 5 mg  5.  Leukocytosis: - No obvious infection - Likely reactionary in the setting of the above  6.  Hypokalemia: - Repleted  7.  Hyponatremia: - Resolved      For questions or updates, please contact [facility] Please consult [email or web address] for contact  info under Cardiology/STEMI.    Signed, Bernardino Bring, PA-C [facility] Pager: [phone] 02/19/2024, 7:23 AM

## 2024-02-19 NOTE — Progress Notes (Signed)
 PHARMACY - ANTICOAGULATION CONSULT NOTE  Pharmacy Consult for IV Heparin   Indication: chest pain/ACS  Patient Measurements: Height: 5' 3 (160 cm) Weight: 70.4 kg (155 lb 3.3 oz) IBW/kg (Calculated) : 52.4 HEPARIN  DW (KG): 67  Labs: Recent Labs    02/16/24 2136 02/16/24 2247 02/16/24 2317 02/17/24 0337 02/17/24 1134 02/17/24 1820 02/18/24 0148 02/18/24 0850 02/18/24 1448 02/18/24 2211  HGB 13.8  --   --  12.8  --   --  13.4  --   --   --   HCT 39.9  --   --  37.0  --   --  38.5  --   --   --   PLT 288  --   --  252  --   --  244  --   --   --   APTT  --  30  --   --   --   --   --   --   --   --   HEPARINUNFRC  --   --   --  0.29*   < >  --  0.24* 0.40 0.26* 0.41  CREATININE 0.59  --   --  0.48  --  0.63 0.74  --   --   --   TROPONINIHS 1,187*  --  911*  --   --   --   --   --   --   --    < > = values in this interval not displayed.    Estimated Creatinine Clearance: 63.3 mL/min (by C-G formula based on SCr of 0.74 mg/dL).   Medical History: Past Medical History:  Diagnosis Date   Hematuria    Hemorrhoids    Hyperlipidemia    Hypertension    Insomnia    Menopausal symptoms    Osteopenia    Assessment: Pharmacy consulted to dose heparin  in this 68 year old female admitted with ACS/NSTEMI.  No prior anticoag noted.  0718 0337 HL 0.29, subthera; 800 un/hr 0718 1134 HL 0.19, subthera; 900 un/hr 0718 1515 heparin  drip stopped for procedure 0719 0148 HL 0.24, SUBtherapeutic 1100 units/hr 0719 0850 HL 0.4  0719 1520 HL 0.26 0719 2211 HL 0.41, therapeutic X 1   Goal of Therapy:  Heparin  level 0.3-0.7 units/ml Monitor platelets by anticoagulation protocol: Yes   Plan:  7/19:  HL @ 2211 = 0.41, therapeutic X 1 - Will continue pt on current rate and recheck HL in 6 hrs. - CBC daily   Kairie Vangieson D, PharmD 02/19/2024

## 2024-02-19 NOTE — Plan of Care (Signed)
  Problem: Education: Goal: Knowledge of General Education information will improve Description: Including pain rating scale, medication(s)/side effects and non-pharmacologic comfort measures Outcome: Progressing   Problem: Clinical Measurements: Goal: Will remain free from infection Outcome: Progressing   Problem: Clinical Measurements: Goal: Cardiovascular complication will be avoided Outcome: Progressing   Problem: Activity: Goal: Risk for activity intolerance will decrease Outcome: Progressing   Problem: Pain Managment: Goal: General experience of comfort will improve and/or be controlled Outcome: Progressing

## 2024-02-19 NOTE — Progress Notes (Addendum)
 Notified that patient was having increasing episodes of tachycardic rates into the 160s to low 200s bpm. Patient hemodynamically stable and without symptoms of angina or decompensation. Review of telemetry shows paroxysms of atrial tachycardia/SVT lasting up to 15.9 seconds. EKG shows SVT converting to sinus rhythm without significant post-termination pause with diffuse TWI consistent with prior tracing and known stress-induced cardiomyopathy.   Potassium and at goal. TSH normal.   Recommendations: -She is currently asymptomatic and in sinus rhythm -Hemodynamically stable at time of repeat evaluation with a BP of 122 mmHg systolic  -She will not tolerate frequent salvos of atrial arrhythmia given cardiomyopathy  -Restart IV amiodarone  with gtt without bolus given repeat BP of 92 mmHg systolic -Monitor QT, EKG order placed for this evening and morning of 7/21 -Avoid QT prolonging medications  -As able, look to add metoprolol  (BP soft on IV amiodarone  on 7/19 leading to the holding of ARB) -Will likely need short-course of oral amiodarone  following IV load (do not anticipate long-term therapy) -If she continues to have paroxysms of atrial arrhythmia or if they become longer lasting, consider EP consult  -Will stop heparin  gtt at this time as arrhythmia is consistent with atrial tach/SVT -Will need to remain admitted at this time given resumption of IV amiodarone 

## 2024-02-19 NOTE — Progress Notes (Signed)
 PHARMACY - ANTICOAGULATION CONSULT NOTE  Pharmacy Consult for IV Heparin   Indication: chest pain/ACS  Patient Measurements: Height: 5' 3 (160 cm) Weight: 70.4 kg (155 lb 3.3 oz) IBW/kg (Calculated) : 52.4 HEPARIN  DW (KG): 67  Labs: Recent Labs    02/16/24 2136 02/16/24 2247 02/16/24 2317 02/17/24 0337 02/17/24 1134 02/17/24 1820 02/18/24 0148 02/18/24 0850 02/18/24 1448 02/18/24 2211 02/19/24 0549  HGB 13.8  --   --  12.8  --   --  13.4  --   --   --  13.5  HCT 39.9  --   --  37.0  --   --  38.5  --   --   --  39.2  PLT 288  --   --  252  --   --  244  --   --   --  236  APTT  --  30  --   --   --   --   --   --   --   --   --   HEPARINUNFRC  --   --   --  0.29*   < >  --  0.24*   < > 0.26* 0.41 0.28*  CREATININE 0.59  --   --  0.48  --  0.63 0.74  --   --   --   --   TROPONINIHS 1,187*  --  911*  --   --   --   --   --   --   --   --    < > = values in this interval not displayed.    Estimated Creatinine Clearance: 63.3 mL/min (by C-G formula based on SCr of 0.74 mg/dL).   Medical History: Past Medical History:  Diagnosis Date   Hematuria    Hemorrhoids    Hyperlipidemia    Hypertension    Insomnia    Menopausal symptoms    Osteopenia    Assessment: Pharmacy consulted to dose heparin  in this 68 year old female admitted with ACS/NSTEMI.  No prior anticoag noted.  0718 0337 HL 0.29, subthera; 800 un/hr 0718 1134 HL 0.19, subthera; 900 un/hr 0718 1515 heparin  drip stopped for procedure 0719 0148 HL 0.24, SUBtherapeutic 1100 units/hr 0719 0850 HL 0.4  0719 1520 HL 0.26 0719 2211 HL 0.41, therapeutic X 1  0720 0549 HL 0.28, SUBtherapeutic   Goal of Therapy:  Heparin  level 0.3-0.7 units/ml Monitor platelets by anticoagulation protocol: Yes   Plan:  7/20:  HL @ 0549 = 0.28, SUBtherapeutic  - will order heparin  1000 units IV X 1 bolus and increase drip rate to 1450 units/hr - recheck HL 6 hrs after rate change  - CBC daily   Sargun Rummell D,  PharmD 02/19/2024

## 2024-02-19 NOTE — Progress Notes (Signed)
 [facility] Progress Note  Patient: Laura Cohen    FMW:[id number]  DOA: 02/16/2024     Date of Service: the patient was seen and examined on 02/19/2024  Chief Complaint  Patient presents with   Chest Pain   Brief hospital course: Laura Cohen is a 68 y.o. female with medical history significant of hypertension, hyperlipidemia.  She presented to the emergency room with complaints of left-sided chest pain and back pain since last night.  Patient apparently had found her mother dead on the floor last night while doing a welfare check on her.  This shocked her extremely and reports having developed symptoms of back pain and chest tightness.  She denied any radiation to the jaw or arm.  Denied any associated exertional symptoms.  Pain has been constant.  She however complains of difficulty catching her breath.  She denies any previous history of coronary disease.   Workup in the ED revealed troponin greater than 1000.  EKG did show evidence of T wave inversions in inferior and lateral leads.     Assessment and Plan:  # Takotsubo cardiomyopathy.  Elevated troponin consistent with NSTEMI.   Troponin peaked at 1187.   - Heparin  IV infusion  - Aspirin  81 mg p.o. daily -Nitroglycerin  as needed -Lipitor 40 mg p.o. daily D-dimer negative, less likely PE BNP 870 elevated TTE: LVEF 30 to 35%, LV basilar hypokinesis with mild apical hypokinesis suggesting stress-induced cardiomyopathy.  LV wall motion abnormalities.  Moderate PAH, mild to moderate TR 7/18 s/p cardiac cath: No significant CAD, severely reduced LVEF. Cardiology recommended gentle diuresis and GDMT  # Atrial arrhythmias Atrial tachycardia versus atrial fibrillation developed after cardiac cath, now back to normal sinus rhythm. Patient received IV amiodarone  infusion after cath, discontinued on 7/19 Plan is to start beta-blocker when blood pressure improves S/p Heparin  IV infusion d/c;d on 7/20, TBD about DOAC, as per  cardio, patient needs Zio patch on discharge. 7/20 developed nonsustained V. tach, patient was restarted on amiodarone  IV infusion   # Hypertension: 7/19 currently patient is hypotensive Home med lisinopril /hydrochlorothiazide  held for now Consider adding beta-blockers due to ACS. Monitor BP and titrate medications accordingly   # Dyslipidemia: Crestor  5 mg p.o. daily started on 7/19 LDL 35, below goal 70-100    # Hypophosphatemia, Phos repleted. Monitor electrolytes and replete as needed.  # Leukocytosis, most likely reactive WBC count improving   Body mass index is 27.49 kg/m.  Interventions:  Diet: Heart healthy diet DVT Prophylaxis: Heparin  IV infusion  Advance goals of care discussion: Full code  Family Communication: family was present at bedside, at the time of interview.  The pt provided permission to discuss medical plan with the family. Opportunity was given to ask question and all questions were answered satisfactorily.   Disposition:  Pt is from home, admitted with non-STEMI, s/p cardiac, developed atrial arrhythmia, s/p amiodarone  IV infusion, still very hypotensive, which precludes a safe discharge. Discharge to home, when stable and cleared by cardiology, most likely tomorrow a.m.  Subjective: No significant events overnight, patient developed nonsustained V. tach but she remained asymptomatic.  Denied any chest pain or repressing no shortness of breath.  Physical Exam: General: NAD, lying comfortably Appear in no distress, affect appropriate Eyes: PERRLA ENT: Oral Mucosa Clear, moist  Neck: no JVD,  Cardiovascular: S1 and S2 Present, no Murmur,  Respiratory: good respiratory effort, Bilateral Air entry equal and Decreased, no Crackles, no wheezes Abdomen: Bowel Sound present, Soft and no tenderness,  Skin: no rashes Extremities: no Pedal edema, no calf tenderness Neurologic: without any new focal findings Gait not checked due to patient safety  concerns  Vitals:   02/19/24 1030 02/19/24 1100 02/19/24 1130 02/19/24 1200  BP: (!) 104/51 (!) 101/50 (!) 101/49 (!) 99/54  Pulse:      Resp: 18 13 14 15   Temp:      TempSrc:      SpO2:      Weight:      Height:        Intake/Output Summary (Last 24 hours) at 02/19/2024 1548 Last data filed at 02/19/2024 1300 Gross per 24 hour  Intake 337.21 ml  Output --  Net 337.21 ml   Filed Weights   02/17/24 1005 02/17/24 1445 02/17/24 1930  Weight: 69.6 kg 69.6 kg 70.4 kg    Data Reviewed: I have personally reviewed and interpreted daily labs, tele strips, imagings as discussed above. I reviewed all nursing notes, pharmacy notes, vitals, pertinent old records I have discussed plan of care as described above with RN and patient/family.  CBC: Recent Labs  Lab 02/16/24 2136 02/17/24 0337 02/18/24 0148 02/19/24 0549  WBC 19.6* 15.6* 13.6* 11.3*  HGB 13.8 12.8 13.4 13.5  HCT 39.9 37.0 38.5 39.2  MCV 88.1 87.1 86.9 88.9  PLT 288 252 244 236   Basic Metabolic Panel: Recent Labs  Lab 02/16/24 2136 02/17/24 0337 02/17/24 1820 02/18/24 0148 02/19/24 0549  NA 131* 130* 132* 136 137  K 3.8 3.6 3.2* 4.0 4.1  CL 95* 98 98 106 106  CO2 24 23 21* 20* 21*  GLUCOSE 132* 122* 175* 118* 103*  BUN 13 11 7* 6* 12  CREATININE 0.59 0.48 0.63 0.74 0.56  CALCIUM  8.9 8.4* 8.4* 8.2* 8.9  MG  --  1.8 1.7 2.7* 2.2  PHOS  --  2.6  --  1.7* 2.6    Studies: No results found.   Scheduled Meds:  amiodarone   150 mg Intravenous Once   aspirin  EC  81 mg Oral Daily   enoxaparin  (LOVENOX ) injection  40 mg Subcutaneous Q24H   feeding supplement  237 mL Oral TID BM   [START ON 02/20/2024] metoprolol  succinate  12.5 mg Oral Daily   rosuvastatin   5 mg Oral Daily   sodium chloride  flush  3 mL Intravenous Q12H   Continuous Infusions:  amiodarone  60 mg/hr (02/19/24 1019)   Followed by   amiodarone      PRN Meds: acetaminophen , acetaminophen , nitroGLYCERIN , sodium chloride  flush  Time spent: 55  minutes  Author: ELVAN SOR. MD [facility] 02/19/2024 3:48 PM  To reach On-call, see care teams to locate the attending and reach out to them via [email or web address]. If 7PM-7AM, please contact night-coverage If you still have difficulty reaching the attending provider, please page the [facility] (Director on Call) for [facility] on amion for assistance.

## 2024-02-19 NOTE — Progress Notes (Signed)
 PHARMACIST - PHYSICIAN COMMUNICATION  CONCERNING:  Enoxaparin  (Lovenox ) for DVT Prophylaxis    RECOMMENDATION: Patient was prescribed enoxaprin 30mg  q12 hours for VTE prophylaxis.   Current dosing for 30mg  BID is for major trauma, spinal cord injury, or selected orthopedic surgery.   Filed Weights   02/17/24 1005 02/17/24 1445 02/17/24 1930  Weight: 69.6 kg (153 lb 6.4 oz) 69.6 kg (153 lb 7 oz) 70.4 kg (155 lb 3.3 oz)    Body mass index is 27.49 kg/m.  Estimated Creatinine Clearance: 63.3 mL/min (by C-G formula based on SCr of 0.56 mg/dL).   Based on [facility] policy patient is candidate for enoxaparin   40mg  every 24 hours.    DESCRIPTION: Pharmacy has adjusted enoxaparin  dose per [facility] policy.  Patient is now receiving enoxaparin  40 mg every 24 hours    Estill CHRISTELLA Lutes, PharmD, BCPS Clinical Pharmacist 02/19/2024 10:31 AM

## 2024-02-20 ENCOUNTER — Encounter: Payer: Self-pay | Admitting: Internal Medicine

## 2024-02-20 ENCOUNTER — Other Ambulatory Visit: Payer: Self-pay

## 2024-02-20 ENCOUNTER — Telehealth (HOSPITAL_COMMUNITY): Payer: Self-pay | Admitting: Pharmacy Technician

## 2024-02-20 ENCOUNTER — Other Ambulatory Visit (HOSPITAL_COMMUNITY): Payer: Self-pay

## 2024-02-20 DIAGNOSIS — I4719 Other supraventricular tachycardia: Secondary | ICD-10-CM | POA: Diagnosis not present

## 2024-02-20 DIAGNOSIS — I5181 Takotsubo syndrome: Secondary | ICD-10-CM | POA: Diagnosis not present

## 2024-02-20 DIAGNOSIS — I214 Non-ST elevation (NSTEMI) myocardial infarction: Secondary | ICD-10-CM

## 2024-02-20 LAB — CBC
HCT: 41.2 % (ref 36.0–46.0)
Hemoglobin: 14.1 g/dL (ref 12.0–15.0)
MCH: 30.7 pg (ref 26.0–34.0)
MCHC: 34.2 g/dL (ref 30.0–36.0)
MCV: 89.6 fL (ref 80.0–100.0)
Platelets: 283 K/uL (ref 150–400)
RBC: 4.6 MIL/uL (ref 3.87–5.11)
RDW: 12.6 % (ref 11.5–15.5)
WBC: 10.6 K/uL — ABNORMAL HIGH (ref 4.0–10.5)
nRBC: 0 % (ref 0.0–0.2)

## 2024-02-20 LAB — BASIC METABOLIC PANEL WITH GFR
Anion gap: 11 (ref 5–15)
BUN: 10 mg/dL (ref 8–23)
CO2: 22 mmol/L (ref 22–32)
Calcium: 8.9 mg/dL (ref 8.9–10.3)
Chloride: 104 mmol/L (ref 98–111)
Creatinine, Ser: 0.52 mg/dL (ref 0.44–1.00)
GFR, Estimated: >60 mL/min (ref >60)
Glucose, Bld: 109 mg/dL — ABNORMAL HIGH (ref 70–99)
Potassium: 4.1 mmol/L (ref 3.5–5.1)
Sodium: 137 mmol/L (ref 135–145)

## 2024-02-20 LAB — PHOSPHORUS: Phosphorus: 3.4 mg/dL (ref 2.5–4.6)

## 2024-02-20 LAB — LIPOPROTEIN A (LPA): Lipoprotein (a): 14.9 nmol/L (ref <75.0)

## 2024-02-20 LAB — MAGNESIUM: Magnesium: 2.2 mg/dL (ref 1.7–2.4)

## 2024-02-20 MED ORDER — METOPROLOL SUCCINATE ER 25 MG PO TB24
12.5000 mg | ORAL_TABLET | Freq: Every day | ORAL | 2 refills | Status: DC
Start: 2024-02-21 — End: 2024-05-17
  Filled 2024-02-20: qty 15, 30d supply, fill #0

## 2024-02-20 MED ORDER — AMIODARONE HCL 200 MG PO TABS: 200.0000 mg | ORAL_TABLET | Freq: Every day | ORAL | Status: DC

## 2024-02-20 MED ORDER — AMIODARONE HCL 200 MG PO TABS: 200.0000 mg | ORAL_TABLET | Freq: Two times a day (BID) | ORAL | Status: DC

## 2024-02-20 MED ORDER — AMIODARONE HCL 200 MG PO TABS
ORAL_TABLET | ORAL | 0 refills | Status: DC
  Filled 2024-02-20: qty 72, 44d supply, fill #0

## 2024-02-20 MED ORDER — AMIODARONE HCL 200 MG PO TABS
400.0000 mg | ORAL_TABLET | Freq: Two times a day (BID) | ORAL | Status: DC
  Administered 2024-02-20: 400 mg via ORAL
  Filled 2024-02-20: qty 2

## 2024-02-20 NOTE — Plan of Care (Signed)
  Problem: Skin Integrity: Goal: Risk for impaired skin integrity will decrease Outcome: Progressing   Problem: Activity: Goal: Ability to return to baseline activity level will improve Outcome: Progressing   Problem: Cardiovascular: Goal: Ability to achieve and maintain adequate cardiovascular perfusion will improve Outcome: Progressing

## 2024-02-20 NOTE — Telephone Encounter (Signed)
 Pharmacy Patient Advocate Encounter  Insurance verification completed.    The patient is insured through [facility]. Patient has Medicare and is not eligible for a copay card, but may be able to apply for patient assistance or Medicare RX Payment Plan (Patient Must reach out to their plan, if eligible for payment plan), if available.    Ran test claim for Farxiga 10mg  tablets and the current 30 day co-pay is $420.00.  Ran test claim for Entresto 24-26mg  tablets and the current 30 day co-pay is $420.00.  Ran test claim for Jardiance  10mg  tablets and the current 30 day co-pay is $420.00.  Copay applied to the deductible of $375.00 plus the $45.00 copay. $45.00 after the deductible.   This test claim was processed through [facility]- copay amounts may vary at other pharmacies due to pharmacy/plan contracts, or as the patient moves through the different stages of their insurance plan.

## 2024-02-20 NOTE — Progress Notes (Signed)
 Rounding Note   Patient Name: Laura Cohen Date of Encounter: 02/20/2024  [facility] Cardiologist: Oneil Parchment, MD   Subjective Patient reports feeling well today with no further episodes of chest discomfort.  Denies shortness of breath, palpitations, and lightheadedness.  No further episodes of atrial arrhythmia noted this morning.  Scheduled Meds:  amiodarone   400 mg Oral BID   Followed by   NOREEN ON 02/27/2024] amiodarone   200 mg Oral BID   Followed by   NOREEN ON 03/05/2024] amiodarone   200 mg Oral Daily   aspirin  EC  81 mg Oral Daily   enoxaparin  (LOVENOX ) injection  40 mg Subcutaneous Q24H   feeding supplement  237 mL Oral TID BM   metoprolol  succinate  12.5 mg Oral Daily   rosuvastatin   5 mg Oral Daily   sodium chloride  flush  3 mL Intravenous Q12H   Continuous Infusions:  PRN Meds: acetaminophen , acetaminophen , nitroGLYCERIN , sodium chloride  flush   Vital Signs  Vitals:   02/19/24 1900 02/19/24 2300 02/20/24 0300 02/20/24 0730  BP:    (!) 112/52  Pulse:  68 62 68  Resp:    12  Temp: 99.3 F (37.4 C) 98.6 F (37 C) 98.4 F (36.9 C) 98.3 F (36.8 C)  TempSrc: Oral Oral Oral Oral  SpO2:  99% 99% 100%  Weight:      Height:        Intake/Output Summary (Last 24 hours) at 02/20/2024 1040 Last data filed at 02/20/2024 0900 Gross per 24 hour  Intake 1047.92 ml  Output 950 ml  Net 97.92 ml      02/17/2024    7:30 PM 02/17/2024    2:45 PM 02/17/2024   10:05 AM  Last 3 Weights  Weight (lbs) 155 lb 3.3 oz 153 lb 7 oz 153 lb 6.4 oz  Weight (kg) 70.4 kg 69.6 kg 69.582 kg      Telemetry Sinus rhythm rate 60-80 with 1 run of atrial tachycardia yesterday 7/28 morning ~0930- Personally Reviewed  Physical Exam  GEN: No acute distress.   Neck: No JVD Cardiac: RRR, no murmurs, rubs, or gallops.  Respiratory: Clear to auscultation bilaterally. GI: Soft, nontender, non-distended  MS: No edema; No deformity. Neuro:  Nonfocal  Psych: Normal affect    Labs High Sensitivity Troponin:   Recent Labs  Lab 02/16/24 2136 02/16/24 2317  TROPONINIHS 1,187* 911*     Chemistry Recent Labs  Lab 02/18/24 0148 02/19/24 0549 02/20/24 0508  NA 136 137 137  K 4.0 4.1 4.1  CL 106 106 104  CO2 20* 21* 22  GLUCOSE 118* 103* 109*  BUN 6* 12 10  CREATININE 0.74 0.56 0.52  CALCIUM  8.2* 8.9 8.9  MG 2.7* 2.2 2.2  GFRNONAA >60 >60 >60  ANIONGAP 10 10 11     Lipids  Recent Labs  Lab 02/17/24 0337  CHOL 116  TRIG 107  HDL 60  LDLCALC 35  CHOLHDL 1.9    Hematology Recent Labs  Lab 02/18/24 0148 02/19/24 0549 02/20/24 0508  WBC 13.6* 11.3* 10.6*  RBC 4.43 4.41 4.60  HGB 13.4 13.5 14.1  HCT 38.5 39.2 41.2  MCV 86.9 88.9 89.6  MCH 30.2 30.6 30.7  MCHC 34.8 34.4 34.2  RDW 12.4 12.8 12.6  PLT 244 236 283   Thyroid  Recent Labs  Lab 02/17/24 0337  TSH 1.998    BNP Recent Labs  Lab 02/17/24 0337  BNP 870.3*    DDimer  Recent Labs  Lab 02/17/24  0127  DDIMER <0.27     Radiology  No results found.  Cardiac Studies LHC 02/17/2024: Conclusions: No angiographically significant coronary artery disease.  Given severely reduced LVEF by echo with mid and apical wall-motion abnormality, findings are consistent with Takotsubo cardiomyopathy. Moderately elevated left ventricular filling pressure (LVEDP 27 mmHg).   Recommendations: Gentle diuresis and initiation of goal-directed medical therapy. Primary prevention of coronary artery disease. __________   2D echo 02/17/2024: 1. There is LV basilar hyperkinesis with mid-apical hypokinesis  suggesting stress-induced cardiomyopathy. Left ventricular ejection  fraction, by estimation, is 30 to 35%. The left ventricle has moderate to  severely decreased function. The left ventricle   demonstrates regional wall motion abnormalities (see scoring  diagram/findings for description). Left ventricular diastolic parameters  are indeterminate.   2. Right ventricular systolic  function is normal. The right ventricular  size is normal. There is moderately elevated pulmonary artery systolic  pressure.   3. The mitral valve is normal in structure. Mild mitral valve  regurgitation.   4. Tricuspid valve regurgitation is mild to moderate.   5. The aortic valve is tricuspid. Aortic valve regurgitation is not  visualized.   6. The inferior vena cava is normal in size with <50% respiratory  variability, suggesting right atrial pressure of 8 mmHg.   Patient Profile   68 y.o. female with atrial tachycardia, hypertension, hyperlipidemia, and remote brief tobacco use who is being seen for the ongoing management of Takotsubo cardiomyopathy.  Assessment & Plan   Takotsubo cardiomyopathy - In the setting of her mother's passing - Echo this admission showed EF 30 to 35% - LHC 7/18 with no significant CAD, cath site remains stable - Appears euvolemic on exam - BP soft, precluding escalation of GDMT - Price of SGLT2 inhibitor $420 - Will plan for follow-up echo in the outpatient setting following optimization of GDMT - If EF remains less than 35%, will need cardiac MRI and EP evaluation - Start metoprolol  as below  Atrial arrhythmia - Concerning for atrial tachycardia versus atypical flutter, unable to exclude short runs of atrial fibrillation with RVR - Overall burden improved on IV amiodarone  with 1 short episode occurring yesterday 7/20 AM and no recurrence since - Will transition from IV to oral amiodarone  - Start low-dose metoprolol  - Will plan for a 2-week ZIO monitor at discharge and further discussion of long-term DOAC pending results  Hypertension - PTA antihypertensives held secondary to hypotension - Low-dose metoprolol  started as above  Hyperlipidemia - Most recent LDL 35 with no evidence of CAD on LHC, continue PTA Crestor  5 mg daily  For questions or updates, please contact [facility] Please consult [email or web address] for contact info under      Signed, Lesley LITTIE Maffucci, PA-C  02/20/2024, 10:40 AM

## 2024-02-20 NOTE — Progress Notes (Addendum)
 Heart Failure Stewardship Pharmacy Note  PCP: Loreli Kins, MD PCP-Cardiologist: Oneil Parchment, MD  HPI: Laura Cohen is a 68 y.o. female with hypertension, hyperlipidemia, and palpitations who presented with chest and back pain. EKG on admission documented to have T wave inversion of inferior and lateral leads. On admission, BNP was 870.3, HS-troponin was 1187, and TSH 1.998. Chest x-ray noted no active disease. Noted to have SVT, occasionally with aberrancy.    Pertinent cardiac history: Echo in 10/2023 showed LVEF of 65-70% with grade I diastolic dysfunction. Echo this admission noted LVEF 30-35%, mid-apical hypokinesis, mild MR, mild to moderate TR. LHC this admission showed no obstructive CAD with LVEDP of 27 mmHg. Diagnosed with Takotsubo cardiomyopathy.   Pertinent Lab Values: Creatinine, Ser  Date Value Ref Range Status  02/20/2024 0.52 0.44 - 1.00 mg/dL Final   BUN  Date Value Ref Range Status  02/20/2024 10 8 - 23 mg/dL Final   Potassium  Date Value Ref Range Status  02/20/2024 4.1 3.5 - 5.1 mmol/L Final   Sodium  Date Value Ref Range Status  02/20/2024 137 135 - 145 mmol/L Final   B Natriuretic Peptide  Date Value Ref Range Status  02/17/2024 870.3 (H) 0.0 - 100.0 pg/mL Final    Comment:    Performed at [facility], [address], [place], [place] [address]   Magnesium   Date Value Ref Range Status  02/20/2024 2.2 1.7 - 2.4 mg/dL Final    Comment:    Performed at [facility], [address] [place], [place] [address]   TSH  Date Value Ref Range Status  02/17/2024 1.998 0.350 - 4.500 uIU/mL Final    Comment:    Performed by a 3rd Generation assay with a functional sensitivity of <=0.01 uIU/mL. Performed at [facility], [address] [place], [place] [address]     Vital Signs:  Temp:  [98.2 F (36.8 C)-99.3 F (37.4 C)] 98.4 F (36.9 C) (07/21 0300) Pulse Rate:  [62-70] 62 (07/21 0300) Cardiac Rhythm: Normal sinus  rhythm (07/21 0742) Resp:  [10-18] 13 (07/20 1800) BP: (73-122)/(46-70) 119/66 (07/20 1800) SpO2:  [99 %] 99 % (07/21 0300)  Intake/Output Summary (Last 24 hours) at 02/20/2024 0751 Last data filed at 02/20/2024 0600 Gross per 24 hour  Intake 687.92 ml  Output 950 ml  Net -262.08 ml    Current Heart Failure Medications:  Loop diuretic: none Beta-Blocker: metoprolol  succinate 12.5 mg daily ACEI/ARB/ARNI: none MRA: none SGLT2i: none Other: none  Prior to admission Heart Failure Medications:  Loop diuretic: none Beta-Blocker: none ACEI/ARB/ARNI: lisinopril  10 mg daily MRA: none SGLT2i: none Other: hydrochlorothiazide  12.5 mg daily  Assessment: 1. Acute systolic heart failure (LVEF 30-35%)  , due to suspected Takotsubo cardiomyopathy. NYHA class II symptoms.  -Symptoms: Patient reports feeling well today. Denies LEE and DOE. Does report mild orthopnea when completely flat. -Volume: Does not appear hypervolemic on exam. LVEDP on LHC was elevated. Has received two doses of furosemide .  -Hemodynamics: BP on lower end of normal. Suspect this will improve in the upcoming days.  -BB: Continue metoprolol  succinate 12.5 mg daily.  -ACEI/ARB/ARNI: Given patient was hypertensive prior to admission, suspect ARB can be added back outpatient when BP improves. -MRA: Would consider adding spironolactone tomorrow vs outpatient if BP remains table ~110 mmHg -SGLT2i: Copay check pending. Can consider adding prior to discharge.  Plan: 1) Medication changes recommended at this time: -None  2) Patient assistance: Jonni, Farxiga, and Jardiance  are $420.00  for the first fill. Pt has a $375.00 deductible plus the $45.00 copay. All will cost $45.00 after the first fill.  3) Education: - Patient has been educated on current HF medications and potential additions to HF medication regimen - Patient verbalizes understanding that over the next few months, these medication doses may change and more  medications may be added to optimize HF regimen - Patient has been educated on basic disease state pathophysiology and goals of therapy  Medication Assistance / Insurance Benefits Check: Does the patient have prescription insurance?    Type of insurance plan:  Does the patient qualify for medication assistance through manufacturers or grants? Pending   Outpatient Pharmacy: Prior to admission outpatient pharmacy: [facility]      Please do not hesitate to reach out with questions or concerns,  Jaun Bash, PharmD, CPP, BCPS, [facility] Heart Failure Pharmacist  Phone - [phone] 02/20/2024 10:14 AM

## 2024-02-20 NOTE — Progress Notes (Signed)
 Heart Failure Nurse Navigator Progress Note  PCP: Loreli Kins, MD PCP-Cardiologist: Lonni Hanson, MD Admission Diagnosis: NSTEMI (non-ST elevated myocardial infarction) [facility]) Admitted from: Home via POV  Presentation:   Laura Cohen presented with complaints of chest pain at the center of her chest, felt like tightness and pressure. No shortness of breath or radiation, not exertional.  Constant for a day. Pain onset was immediately after checking on her elderly mother and discovering that she had died at home. HS-Troponin 1,187.  BNP 870.3. Chest x-ray:notes no active disease.Noted to have SVT, occasionally with aberrancy.  ECHO/ LVEF: 30-35% Mid-apical hypokinesis suggesting stress-induced cardiomyopathy.  Diagnosed with Takotsubo cardiomyopathy.  Clinical Course:  Past Medical History:  Diagnosis Date   Hematuria    Hemorrhoids    Hyperlipidemia    Hypertension    Insomnia    Menopausal symptoms    Osteopenia      Social History   Socioeconomic History   Marital status: Married    Spouse name: Alm   Number of children: 3   Years of education: Not on file   Highest education level: Not on file  Occupational History   Not on file  Tobacco Use   Smoking status: Never   Smokeless tobacco: Not on file  Substance and Sexual Activity   Alcohol use: No    Alcohol/week: 0.0 standard drinks of alcohol   Drug use: No   Sexual activity: Not on file  Other Topics Concern   Not on file  Social History Narrative   Lives with spouse in a home with a Dog (multipoo).   Social Drivers of Corporate investment banker Strain: Not on file  Food Insecurity: No Food Insecurity (02/17/2024)   Hunger Vital Sign    Worried About Running Out of Food in the Last Year: Never true    Ran Out of Food in the Last Year: Never true  Transportation Needs: No Transportation Needs (02/17/2024)   PRAPARE - Administrator, Civil Service (Medical): No    Lack of Transportation  (Non-Medical): No  Physical Activity: Not on file  Stress: Not on file  Social Connections: Socially Integrated (02/17/2024)   Social Connection and Isolation Panel    Frequency of Communication with Friends and Family: More than three times a week    Frequency of Social Gatherings with Friends and Family: Once a week    Attends Religious Services: More than 4 times per year    Active Member of [employer] or Organizations: Yes    Attends Engineer, structural: More than 4 times per year    Marital Status: Married   Water engineer and Provision:  Detailed education and instructions provided on heart failure disease management including the following:  Family members present for Heart Failure Education.  Signs and symptoms of Heart Failure When to call the physician Importance of daily weights Low sodium diet Fluid restriction Medication management Anticipated future follow-up appointments  Patient education given on each of the above topics.  Patient acknowledges understanding via teach back method and acceptance of all instructions.  Education Materials:  Living Better With Heart Failure Booklet, HF zone tool, & Daily Weight Tracker Tool.  Patient has scale at home: Yes Patient has pill box at home: Yes    High Risk Criteria for Readmission and/or Poor Patient Outcomes: Heart failure hospital admissions (last 6 months): 0  No Show rate: 3% Difficult social situation: Anxiety Demonstrates medication adherence: Yes Primary Language: [place] Literacy  level: Reading, Writing, Comprehension  Barriers of Care:   Recent social stressors  Considerations/Referrals:   Referral made to Heart Failure Pharmacist Stewardship: Yes Referral made to Heart Failure CSW/NCM TOC: No Referral made to Heart & Vascular TOC clinic: Yes  Items for Follow-up on DC/TOC: Daily Weights Diet & Fluid Restrictions Continued Heart Failure Education Patient would be open to cardiac rehab  if appropriate.  Charmaine Pines, RN, BSN [facility] Heart Failure Navigator Secure Chat Only

## 2024-02-20 NOTE — Plan of Care (Signed)
  Problem: Education: Goal: Understanding of cardiac disease, CV risk reduction, and recovery process will improve Outcome: Progressing Goal: Individualized Educational Video(s) Outcome: Progressing   Problem: Activity: Goal: Ability to tolerate increased activity will improve Outcome: Progressing   Problem: Cardiac: Goal: Ability to achieve and maintain adequate cardiovascular perfusion will improve Outcome: Progressing   Problem: Health Behavior/Discharge Planning: Goal: Ability to safely manage health-related needs after discharge will improve Outcome: Progressing   Problem: Education: Goal: Knowledge of General Education information will improve Description: Including pain rating scale, medication(s)/side effects and non-pharmacologic comfort measures Outcome: Progressing   Problem: Health Behavior/Discharge Planning: Goal: Ability to manage health-related needs will improve Outcome: Progressing   Problem: Clinical Measurements: Goal: Ability to maintain clinical measurements within normal limits will improve Outcome: Progressing Goal: Will remain free from infection Outcome: Progressing Goal: Diagnostic test results will improve Outcome: Progressing Goal: Cardiovascular complication will be avoided Outcome: Progressing   Problem: Activity: Goal: Risk for activity intolerance will decrease Outcome: Progressing   Problem: Nutrition: Goal: Adequate nutrition will be maintained Outcome: Progressing   Problem: Coping: Goal: Level of anxiety will decrease Outcome: Progressing   Problem: Elimination: Goal: Will not experience complications related to bowel motility Outcome: Progressing Goal: Will not experience complications related to urinary retention Outcome: Progressing   Problem: Pain Managment: Goal: General experience of comfort will improve and/or be controlled Outcome: Progressing   Problem: Safety: Goal: Ability to remain free from injury will  improve Outcome: Progressing   Problem: Skin Integrity: Goal: Risk for impaired skin integrity will decrease Outcome: Progressing   Problem: Education: Goal: Understanding of CV disease, CV risk reduction, and recovery process will improve Outcome: Progressing Goal: Individualized Educational Video(s) Outcome: Progressing   Problem: Activity: Goal: Ability to return to baseline activity level will improve Outcome: Progressing   Problem: Cardiovascular: Goal: Ability to achieve and maintain adequate cardiovascular perfusion will improve Outcome: Progressing Goal: Vascular access site(s) Level 0-1 will be maintained Outcome: Progressing   Problem: Health Behavior/Discharge Planning: Goal: Ability to safely manage health-related needs after discharge will improve Outcome: Progressing

## 2024-02-20 NOTE — Discharge Summary (Signed)
 Physician Discharge Summary  Laura Cohen FMW:[id number] DOB: 1956/04/20 DOA: 02/16/2024  PCP: Laura Kins, MD  Admit date: 02/16/2024 Discharge date: 02/20/2024  Admitted From: Home  Discharge disposition: Home   Recommendations for Outpatient Follow-Up:   Follow up with your primary care provider in one week.  Check CBC, BMP, magnesium  in the next visit Follow-up with cardiology as has been scheduled for regular follow-up, Zio patch follow-up   Discharge Diagnosis:   Principal Problem:   NSTEMI, initial episode of care [facility]) Active Problems:   SVT (supraventricular tachycardia) (HCC)   Takotsubo cardiomyopathy   Discharge Condition: Improved.  Diet recommendation: Low sodium, heart healthy.    Wound care: None.  Code status: Full.   History of Present Illness:   Laura Cohen is a 68 y.o. female with past medical history significant of hypertension, hyperlipidemia presented to hospital with left-sided chest pain and back pain.  Patient apparently had found her mother dead on the floor last night while doing a welfare check on her and subsequently had back pain and chest tightness.  She denied any radiation to the jaw or arm.  Denied any associated exertional symptoms. Workup in the ED revealed troponin greater than 1000.  EKG with evidence of T wave inversions in inferior and lateral leads.  Patient was then admitted hospital for further evaluation and treatment.   Hospital Course:   Following conditions were addressed during hospitalization as listed below,    Takotsubo cardiomyopathy.  NSTEMI.   Troponin peaked at 1187.  Received IV heparin  and aspirin  Lipitor.  2D echocardiogram showed LV ejection fraction of 30 to 35% with hypokinesis of the apex suggestive of stress-induced cardiomyopathy.  Patient then underwent cardiac catheterization on 02/17/2020 without any significant CAD.  Continue metoprolol  and Crestor .  Due to low blood pressure GDMT has not  been initiated and will be initiated as outpatient as per cardiology.  Patient will need 2D echocardiogram as outpatient in 6 weeks or so to reassess left ventricular ejection fraction.   Atrial arrhythmias Likely atrial tachycardia/SVT developed after cardiac cath, had nonsustained V. tach 02/19/2024.  Initially received IV amiodarone  but has been started on oral amiodarone  at this time.  Has been started on metoprolol  since today.  Patient will continue with tapering dose of amiodarone  as outpatient.  Plan for 14 days ZIO patch on discharge to be arranged by cardiology.    # Hypertension:  Currently blood pressure is okay Continue to hold lisinopril  HCTZ until seen as outpatient.Laura Cohen  Has been started on beta-blockers.   Dyslipidemia: Continue Crestor . LDL 35, below goal 70-100    Hypophosphatemia, improved after replacement.  Latest phosphorus of 3.4   Leukocytosis, most likely reactive, WBC count improving  Disposition.  At this time, patient is stable for disposition home with outpatient cardiology and PCP follow-up.  Spoke with the patient's spouse at bedside.  Medical Consultants:   Cardiology  Procedures:    Cardiac catheterization on 02/17/2024 Subjective:   Today, patient was seen and examined at bedside.  Denies any chest pain, dyspnea, dizziness, lightheadedness.  Feels okay and wants to go home.  Seen by cardiology and okay for discharge.  Discharge Exam:   Vitals:   02/20/24 1200 02/20/24 1604  BP: (!) 111/55 118/61  Pulse: 63 64  Resp: (!) 22 20  Temp: 98.2 F (36.8 C) 98.7 F (37.1 C)  SpO2: 99% 98%   Vitals:   02/20/24 0300 02/20/24 0730 02/20/24 1200 02/20/24 1604  BP:  ROLLEN)  112/52 (!) 111/55 118/61  Pulse: 62 68 63 64  Resp:  12 (!) 22 20  Temp: 98.4 F (36.9 C) 98.3 F (36.8 C) 98.2 F (36.8 C) 98.7 F (37.1 C)  TempSrc: Oral Oral Oral Oral  SpO2: 99% 100% 99% 98%  Weight:      Height:       Body mass index is 27.49 kg/m.  General: Alert awake,  not in obvious distress HENT: pupils equally reacting to light,  No scleral pallor or icterus noted. Oral mucosa is moist.  Chest:  Clear breath sounds.  . No crackles or wheezes.  CVS: S1 &S2 heard. No murmur.  Regular rate and rhythm. Abdomen: Soft, nontender, nondistended.  Bowel sounds are heard.   Extremities: No cyanosis, clubbing or edema.  Peripheral pulses are palpable. Psych: Alert, awake and oriented, normal mood CNS:  No cranial nerve deficits.  Power equal in all extremities.   Skin: Warm and dry.  No rashes noted.  The results of significant diagnostics from this hospitalization (including imaging, microbiology, ancillary and laboratory) are listed below for reference.     Diagnostic Studies:   CARDIAC CATHETERIZATION Result Date: 02/17/2024 Conclusions: No angiographically significant coronary artery disease.  Given severely reduced LVEF by echo with mid and apical wall-motion abnormality, findings are consistent with Takotsubo cardiomyopathy. Moderately elevated left ventricular filling pressure (LVEDP 27 mmHg). Recommendations: Gentle diuresis and initiation of goal-directed medical therapy. Primary prevention of coronary artery disease. Laura Hanson, MD [facility]  ECHOCARDIOGRAM COMPLETE Result Date: 02/17/2024    ECHOCARDIOGRAM REPORT   Patient Name:   Laura Cohen Date of Exam: 02/17/2024 Medical Rec #:  [id number]      Height:       63.0 in Accession #:    [id number]     Weight:       153.4 lb Date of Birth:  [birth year]      BSA:          1.727 m Patient Age:    67 years       BP:           129/65 mmHg Patient Gender: F              HR:           97 bpm. Exam Location:  [facility] Procedure: 2D Echo, Cardiac Doppler, Color Doppler and Strain Analysis (Both            Spectral and Color Flow Doppler were utilized during procedure). Indications:     NSTEMI I21.4  History:         Patient has prior history of Echocardiogram examinations, most                  recent 10/21/2021.  Abnormal ECG.  Sonographer:     Laura Cohen Referring Phys:  [id number] Laura Cohen Diagnosing Phys: Laura Cave MD IMPRESSIONS  1. There is LV basilar hyperkinesis with mid-apical hypokinesis suggesting stress-induced cardiomyopathy. Left ventricular ejection fraction, by estimation, is 30 to 35%. The left ventricle has moderate to severely decreased function. The left ventricle  demonstrates regional wall motion abnormalities (see scoring diagram/findings for description). Left ventricular diastolic parameters are indeterminate.  2. Right ventricular systolic function is normal. The right ventricular size is normal. There is moderately elevated pulmonary artery systolic pressure.  3. The mitral valve is normal in structure. Mild mitral valve regurgitation.  4. Tricuspid valve regurgitation is mild to moderate.  5. The aortic valve is  tricuspid. Aortic valve regurgitation is not visualized.  6. The inferior vena cava is normal in size with <50% respiratory variability, suggesting right atrial pressure of 8 mmHg. FINDINGS  Left Ventricle: There is LV basilar hyperkinesis with mid-apical hypokinesis suggesting stress-induced cardiomyopathy. Left ventricular ejection fraction, by estimation, is 30 to 35%. The left ventricle has moderate to severely decreased function. The left ventricle demonstrates regional wall motion abnormalities. Definity  contrast agent was given IV to delineate the left ventricular endocardial borders. The left ventricular internal cavity size was normal in size. There is no left ventricular hypertrophy. Left ventricular diastolic parameters are indeterminate. Right Ventricle: The right ventricular size is normal. No increase in right ventricular wall thickness. Right ventricular systolic function is normal. There is moderately elevated pulmonary artery systolic pressure. The tricuspid regurgitant velocity is 3.17 m/s, and with an assumed right atrial pressure of 8 mmHg, the  estimated right ventricular systolic pressure is 48.2 mmHg. Left Atrium: Left atrial size was normal in size. Right Atrium: Right atrial size was normal in size. Pericardium: There is no evidence of pericardial effusion. Mitral Valve: The mitral valve is normal in structure. Mild mitral valve regurgitation. MV peak gradient, 3.2 mmHg. The mean mitral valve gradient is 2.0 mmHg. Tricuspid Valve: The tricuspid valve is normal in structure. Tricuspid valve regurgitation is mild to moderate. Aortic Valve: The aortic valve is tricuspid. Aortic valve regurgitation is not visualized. Aortic valve mean gradient measures 3.0 mmHg. Aortic valve peak gradient measures 4.6 mmHg. Aortic valve area, by VTI measures 2.41 cm. Pulmonic Valve: The pulmonic valve was normal in structure. Pulmonic valve regurgitation is mild. Aorta: The aortic root and ascending aorta are structurally normal, with no evidence of dilitation. Venous: The inferior vena cava was not well visualized. The inferior vena cava is normal in size with less than 50% respiratory variability, suggesting right atrial pressure of 8 mmHg. IAS/Shunts: No atrial level shunt detected by color flow Doppler.  LEFT VENTRICLE PLAX 2D LVIDd:         4.30 cm     Diastology LVIDs:         2.70 cm     LV e' medial:    6.64 cm/s LV PW:         1.10 cm     LV E/e' medial:  11.6 LV IVS:        0.80 cm     LV e' lateral:   9.25 cm/s LVOT diam:     1.90 cm     LV E/e' lateral: 8.3 LV SV:         40 LV SV Index:   23 LVOT Area:     2.84 cm  LV Volumes (MOD) LV vol d, MOD A2C: 80.7 ml LV vol d, MOD A4C: 78.0 ml LV vol s, MOD A2C: 46.3 ml LV vol s, MOD A4C: 42.5 ml LV SV MOD A2C:     34.4 ml LV SV MOD A4C:     78.0 ml LV SV MOD BP:      36.1 ml RIGHT VENTRICLE RV Basal diam:  3.00 cm RV Mid diam:    2.20 cm RV S prime:     18.00 cm/s TAPSE (M-mode): 2.2 cm LEFT ATRIUM             Index        RIGHT ATRIUM           Index LA diam:        3.50 cm 2.03  cm/m   RA Area:     10.30 cm LA  Vol (A2C):   43.4 ml 25.12 ml/m  RA Volume:   19.00 ml  11.00 ml/m LA Vol (A4C):   13.1 ml 7.58 ml/m LA Biplane Vol: 26.3 ml 15.22 ml/m  AORTIC VALVE                    PULMONIC VALVE AV Area (Vmax):    2.24 cm     PV Vmax:        1.05 m/s AV Area (Vmean):   2.21 cm     PV Vmean:       67.800 cm/s AV Area (VTI):     2.41 cm     PV VTI:         0.180 m AV Vmax:           107.00 cm/s  PV Peak grad:   4.4 mmHg AV Vmean:          73.100 cm/s  PV Mean grad:   2.0 mmHg AV VTI:            0.167 m      RVOT Peak grad: 2 mmHg AV Peak Grad:      4.6 mmHg AV Mean Grad:      3.0 mmHg LVOT Vmax:         84.40 cm/s LVOT Vmean:        57.100 cm/s LVOT VTI:          0.142 m LVOT/AV VTI ratio: 0.85  AORTA Ao Root diam: 2.80 cm Ao Asc diam:  2.90 cm MITRAL VALVE               TRICUSPID VALVE MV Area (PHT): 4.31 cm    TR Peak grad:   40.2 mmHg MV Area VTI:   1.75 cm    TR Mean grad:   30.0 mmHg MV Peak grad:  3.2 mmHg    TR Vmax:        317.00 cm/s MV Mean grad:  2.0 mmHg    TR Vmean:       270.0 cm/s MV Vmax:       0.89 m/s MV Vmean:      60.3 cm/s   SHUNTS MV Decel Time: 176 msec    Systemic VTI:  0.14 m MV E velocity: 77.00 cm/s  Systemic Diam: 1.90 cm MV A velocity: 74.40 cm/s  Pulmonic VTI:  0.133 m MV E/A ratio:  1.03 Laura Cave MD Electronically signed by Laura Cave MD Signature Date/Time: 02/17/2024/4:36:11 PM    Final      Labs:   Basic Metabolic Panel: Recent Labs  Lab 02/17/24 9662 02/17/24 1820 02/18/24 0148 02/19/24 0549 02/20/24 0508  NA 130* 132* 136 137 137  K 3.6 3.2* 4.0 4.1 4.1  CL 98 98 106 106 104  CO2 23 21* 20* 21* 22  GLUCOSE 122* 175* 118* 103* 109*  BUN 11 7* 6* 12 10  CREATININE 0.48 0.63 0.74 0.56 0.52  CALCIUM  8.4* 8.4* 8.2* 8.9 8.9  MG 1.8 1.7 2.7* 2.2 2.2  PHOS 2.6  --  1.7* 2.6 3.4   GFR Estimated Creatinine Clearance: 63.3 mL/min (by C-G formula based on SCr of 0.52 mg/dL). Liver Function Tests: No results for input(s): AST, ALT, ALKPHOS,  BILITOT, PROT, ALBUMIN in the last 168 hours. No results for input(s): LIPASE, AMYLASE in the last 168 hours. No results for input(s): AMMONIA in the last  168 hours. Coagulation profile No results for input(s): INR, PROTIME in the last 168 hours.  CBC: Recent Labs  Lab 02/16/24 2136 02/17/24 0337 02/18/24 0148 02/19/24 0549 02/20/24 0508  WBC 19.6* 15.6* 13.6* 11.3* 10.6*  HGB 13.8 12.8 13.4 13.5 14.1  HCT 39.9 37.0 38.5 39.2 41.2  MCV 88.1 87.1 86.9 88.9 89.6  PLT 288 252 244 236 283   Cardiac Enzymes: No results for input(s): CKTOTAL, CKMB, CKMBINDEX, TROPONINI in the last 168 hours. BNP: Invalid input(s): POCBNP CBG: Recent Labs  Lab 02/17/24 1930  GLUCAP 150*   D-Dimer No results for input(s): DDIMER in the last 72 hours. Hgb A1c No results for input(s): HGBA1C in the last 72 hours. Lipid Profile No results for input(s): CHOL, HDL, LDLCALC, TRIG, CHOLHDL, LDLDIRECT in the last 72 hours. Thyroid function studies No results for input(s): TSH, T4TOTAL, T3FREE, THYROIDAB in the last 72 hours.  Invalid input(s): FREET3 Anemia work up No results for input(s): VITAMINB12, FOLATE, FERRITIN, TIBC, IRON, RETICCTPCT in the last 72 hours. Microbiology Recent Results (from the past 240 hours)  MRSA Next Gen by PCR, Nasal     Status: None   Collection Time: 02/17/24  7:43 PM   Specimen: Nasal Mucosa; Nasal Swab  Result Value Ref Range Status   MRSA by PCR Next Gen NOT DETECTED NOT DETECTED Final    Comment: (NOTE) The GeneXpert MRSA Assay (FDA approved for NASAL specimens only), is one component of a comprehensive MRSA colonization surveillance program. It is not intended to diagnose MRSA infection nor to guide or monitor treatment for MRSA infections. Test performance is not FDA approved in patients less than 51 years old. Performed at [facility], [address], [place], [place] [address]       Discharge Instructions:   Discharge Instructions     Call MD for:  difficulty breathing, headache or visual disturbances   Complete by: As directed    Call MD for:  severe uncontrolled pain   Complete by: As directed    Diet - low sodium heart healthy   Complete by: As directed    Discharge instructions   Complete by: As directed    Follow-up with your primary care provider in 1 week.  Check blood work at that time.  Take medications as prescribed.  Follow-up with cardiology as scheduled by the clinic on 02/23/2024.  Continue heart monitor as outpatient.  Seek medical attention for worsening symptoms.   Increase activity slowly   Complete by: As directed       Allergies as of 02/20/2024       Reactions   Alendronate Other (See Comments)   Penicillins Hives, Itching   Amoxicillin Rash   Amoxicillin-pot Clavulanate Rash        Medication List     STOP taking these medications    ibuprofen 200 MG tablet Commonly known as: ADVIL   lisinopril -hydrochlorothiazide  10-12.5 MG tablet Commonly known as: ZESTORETIC        TAKE these medications    acetaminophen  500 MG tablet Commonly known as: TYLENOL  Take 1,000 mg by mouth every 8 (eight) hours as needed for moderate pain (pain score 4-6).   amiodarone  200 MG tablet Commonly known as: PACERONE  Take 2 tablets (400 mg total) by mouth 2 (two) times daily for 7 days, THEN 1 tablet (200 mg total) 2 (two) times daily for 7 days, THEN 1 tablet (200 mg total) daily. Start taking on: February 20, 2024   aspirin  EC 81 MG  tablet Take 81 mg by mouth daily. Swallow whole.   BIOTIN PO Take 2 each by mouth daily.   diclofenac Sodium 1 % Gel Commonly known as: VOLTAREN Apply 2-4 g topically daily as needed.   FISH OIL PO Take 1 capsule by mouth daily.   metoprolol  succinate 25 MG 24 hr tablet Commonly known as: TOPROL -XL Take 0.5 tablets (12.5 mg total) by mouth daily. Start taking on: February 21, 2024   multivitamin  tablet Take 1 tablet by mouth daily.   rosuvastatin  5 MG tablet Commonly known as: CRESTOR  Take 5 mg by mouth daily.   TURMERIC (CURCUMIN) PO Take 2 each by mouth daily.   VITAMIN B COMPLEX PO Take 1 tablet by mouth daily.        Follow-up Information     [facility]. Go on 02/23/2024.   Specialty: Cardiology Why: Hospital Follow-Up 02/23/24 @ 9:00 AM Contact information: [address] [place]  [address] [phone]        Laura Kins, MD Follow up in 1 week(s).   Specialty: Family Medicine Contact information: [address] [place] [address] [phone]                  Time coordinating discharge: 39 minutes  Signed:  Therman Hughlett  [facility] 02/20/2024, 5:10 PM

## 2024-02-22 ENCOUNTER — Telehealth: Payer: Self-pay | Admitting: Emergency Medicine

## 2024-02-22 ENCOUNTER — Telehealth: Payer: Self-pay | Admitting: Family

## 2024-02-22 NOTE — Telephone Encounter (Signed)
-----   Message from Bernardino Bring sent at 02/19/2024  7:56 AM EDT ----- Good morning. Marcus, can you get this patient set up with a Zio XT for 14 days. Dx: SVT. Can we get her hospital follow up with me, if possible, in 7-10 days? Thanks.

## 2024-02-22 NOTE — Telephone Encounter (Signed)
 Called to confirm/remind patient of their appointment [facility] on 02/23/24.   Appointment:   [x] Confirmed  [] Left mess   [] No answer/No voice mail  [] VM Full/unable to leave message  [] Phone not in service  Patient reminded to bring all medications and/or complete list.  Confirmed patient has transportation. Gave directions, instructed to utilize valet parking.

## 2024-02-22 NOTE — Telephone Encounter (Signed)
 The patient has been notified of the Zio XT ordered and application procedure discussed, - verbalized understanding. All questions (if any) were answered. Reminded of upcoming appointment and encouraged to call if any concerns or questions arise

## 2024-02-22 NOTE — Progress Notes (Unsigned)
 [facility] Note   Referring Physician: admission PCP: Loreli Kins, MD Cardiologist: Oneil Parchment, MD   Chief Complaint: fatigue   HPI:  Laura Cohen is a 68 y/o female with a history of  hypertension, hyperlipidemia, and palpitations, SVT and HFrEF. Echo 10/21/21 showed LVEF of 65-70% with grade I diastolic dysfunction.   Admitted 02/16/24 with chest and back pain after finding her mom deceased on the floor after doing a welfare check. EKG on admission documented to have T wave inversion of inferior and lateral leads. On admission, BNP was 870.3, HS-troponin was 1187, and TSH 1.998. Chest x-ray noted no active disease. Noted to have SVT, occasionally with aberrancy. Echo 02/17/24: LVEF 30-35%, mid-apical hypokinesis, mild MR, mild to moderate TR. LHC 02/17/24: no obstructive CAD with LVEDP of 27 mmHg. Diagnosed with Takotsubo cardiomyopathy. Given IV amiodarone  and transitioned to oral amiodarone  for SVT.   She presents today, with her husband & sister, for her initial HF visit with a chief complaint of fatigue. Has associated chest tightness (improving), rare flutter, occasional dizziness (fog). Sleeping well on 1 pillow. She is slowly increasing her activity. Due to fear of elevators, she walked up the flight of stairs to our office. Did stop mid way and then had to stop every couple of steps.   Due to the room being stuffy, patient asked for the door to be open during visit. Remote tobacco use, rare alcohol and no drug use.   Review of Systems: [y] = yes, [ ]  = no   General: Weight gain [ ] ; Weight loss [ ] ; Anorexia [ ] ; Fatigue [ y]; Fever [ ] ; Chills [ ] ; Weakness [ ]   Cardiac: Chest pain/pressure [ ] ; Resting SOB [ ] ; Exertional SOB [ ] ; Orthopnea [ ] ; Pedal Edema [ ] ; Palpitations Davis.Dad ]; Syncope [ ] ; Presyncope [ ] ; Paroxysmal nocturnal dyspnea[ ]   Pulmonary: Cough [ ] ; Wheezing[ ] ; Hemoptysis[ ] ; Sputum [ ] ; Snoring [ ]   GI: Vomiting[ ] ; Dysphagia[ ] ; Melena[ ] ;  Hematochezia [ ] ; Heartburn[ ] ; Abdominal pain [ ] ; Constipation [ ] ; Diarrhea [ ] ; BRBPR [ ]   GU: Hematuria[ ] ; Dysuria [ ] ; Nocturia[ ]   Vascular: Pain in legs with walking [ ] ; Pain in feet with lying flat [ ] ; Non-healing sores [ ] ; Stroke [ ] ; TIA [ ] ; Slurred speech [ ] ;  Neuro: Dizziness [y]; Vertigo[ ] ; Seizures[ ] ; Paresthesias[ ] ;Blurred vision [ ] ; Diplopia [ ] ; Vision changes [ ]   Ortho/Skin: Arthritis [ ] ; Joint pain [ ] ; Muscle pain [ ] ; Joint swelling [ ] ; Back Pain [ ] ; Rash [ ]   Psych: Depression[ ] ; Anxiety[ ]   Heme: Bleeding problems [ ] ; Clotting disorders [ ] ; Anemia [ ]   Endocrine: Diabetes [ ] ; Thyroid dysfunction[ ]    Past Medical History:  Diagnosis Date   Hematuria    Hemorrhoids    Hyperlipidemia    Hypertension    Insomnia    Menopausal symptoms    Osteopenia     Current Outpatient Medications  Medication Sig Dispense Refill   acetaminophen  (TYLENOL ) 500 MG tablet Take 1,000 mg by mouth every 8 (eight) hours as needed for moderate pain (pain score 4-6).     amiodarone  (PACERONE ) 200 MG tablet Take 2 tablets (400 mg total) by mouth 2 (two) times daily for 7 days, THEN 1 tablet (200 mg total) 2 (two) times daily for 7 days, THEN 1 tablet (200 mg total) daily. 72 tablet 0   aspirin  EC 81 MG  tablet Take 81 mg by mouth daily. Swallow whole.     B Complex Vitamins (VITAMIN B COMPLEX PO) Take 1 tablet by mouth daily.     BIOTIN PO Take 2 each by mouth daily.     diclofenac Sodium (VOLTAREN) 1 % GEL Apply 2-4 g topically daily as needed.     metoprolol  succinate (TOPROL -XL) 25 MG 24 hr tablet Take 0.5 tablets (12.5 mg total) by mouth daily. 15 tablet 2   Misc Natural Products (TURMERIC, CURCUMIN, PO) Take 2 each by mouth daily.     Multiple Vitamin (MULTIVITAMIN) tablet Take 1 tablet by mouth daily.     Omega-3 Fatty Acids (FISH OIL PO) Take 1 capsule by mouth daily.     rosuvastatin  (CRESTOR ) 5 MG tablet Take 5 mg by mouth daily.     No current  facility-administered medications for this visit.    Allergies  Allergen Reactions   Alendronate Other (See Comments)   Penicillins Hives and Itching   Amoxicillin Rash   Amoxicillin-Pot Clavulanate Rash      Social History   Socioeconomic History   Marital status: Married    Spouse name: Alm   Number of children: 3   Years of education: Not on file   Highest education level: Not on file  Occupational History   Not on file  Tobacco Use   Smoking status: Never   Smokeless tobacco: Not on file  Substance and Sexual Activity   Alcohol use: No    Alcohol/week: 0.0 standard drinks of alcohol   Drug use: No   Sexual activity: Not on file  Other Topics Concern   Not on file  Social History Narrative   Lives with spouse in a home with a Dog (multipoo).   Social Drivers of Corporate investment banker Strain: Low Risk  (02/20/2024)   Overall Financial Resource Strain (CARDIA)    Difficulty of Paying Living Expenses: Not hard at all  Food Insecurity: No Food Insecurity (02/20/2024)   Hunger Vital Sign    Worried About Running Out of Food in the Last Year: Never true    Ran Out of Food in the Last Year: Never true  Transportation Needs: No Transportation Needs (02/20/2024)   PRAPARE - Administrator, Civil Service (Medical): No    Lack of Transportation (Non-Medical): No  Physical Activity: Not on file  Stress: Not on file  Social Connections: Socially Integrated (02/17/2024)   Social Connection and Isolation Panel    Frequency of Communication with Friends and Family: More than three times a week    Frequency of Social Gatherings with Friends and Family: Once a week    Attends Religious Services: More than 4 times per year    Active Member of [employer] or Organizations: Yes    Attends Engineer, structural: More than 4 times per year    Marital Status: Married  Catering manager Violence: Not At Risk (02/17/2024)   Humiliation, Afraid, Rape, and Kick  questionnaire    Fear of Current or Ex-Partner: No    Emotionally Abused: No    Physically Abused: No    Sexually Abused: No      Family History  Problem Relation Age of Onset   Hypertension Mother    CAD Mother    Hypertension Father    Dementia Father    Cancer Sister    Melanoma Brother    Breast cancer Cousin    Hypertension Other    Vitals:  02/23/24 0845  BP: (!) 130/59  Pulse: 66  SpO2: 99%  Weight: 149 lb 9.6 oz (67.9 kg)   Wt Readings from Last 3 Encounters:  02/23/24 149 lb 9.6 oz (67.9 kg)  02/17/24 155 lb 3.3 oz (70.4 kg)  09/24/21 169 lb (76.7 kg)   Lab Results  Component Value Date   CREATININE 0.52 02/20/2024   CREATININE 0.56 02/19/2024   CREATININE 0.74 02/18/2024    PHYSICAL EXAM:  General: Well appearing.  Cor: No JVD. Regular rhythm, rate.  Lungs: clear Abdomen: soft, nontender, nondistended. Extremities: no edema Neuro:. 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 Previous EF had been normal- - recent cath showed no obstruction - NYHA class II - euvolemic - weighing daily - Echo 10/21/21: LVEF of 65-70% with grade I diastolic dysfunction.  - Echo 2/81/74: LVEF 30-35%, mid-apical hypokinesis, mild MR, mild to moderate TR.  - LHC 02/17/24: no obstructive CAD with LVEDP of 27 mmHg.  - continue metoprolol  succinate 12.5mg  daily - begin jardiance  10mg  daily. 30 day voucher provided - will give lasix  20mg  PRN that she can take if needed for weight gain, SOB. Have asked that if during the week, she contact the office first for guidance.  - discussed cardiac rehab after a few more weeks - not adding salt to her food and has been reading food labels for sodium content - BMET today. Repeat next visit - BNP 02/17/24 was 870.3  2: HTN- - BP 130/59 - sees PCP Morrell) - BMET 02/20/24 reviewed: sodium 137, potassium 4.1, creatinine 0.52  & GFR >60 - BMET today  3: SVT-  - EKG is NSR, HR 67 - continue amiodarone  taper - waiting on zio monitor to be delivered to her home - BMET/ Mg today  4: Hyperlipidemia- - LDL 02/17/24 was 35 - continue rosuvastatin  5mg  daily   Return in 6 weeks, sooner if needed.   Ellouise DELENA Class, FNP 02/22/24

## 2024-02-23 ENCOUNTER — Ambulatory Visit (HOSPITAL_BASED_OUTPATIENT_CLINIC_OR_DEPARTMENT_OTHER): Admitting: Family

## 2024-02-23 ENCOUNTER — Other Ambulatory Visit (HOSPITAL_COMMUNITY): Payer: Self-pay

## 2024-02-23 ENCOUNTER — Other Ambulatory Visit: Payer: Self-pay | Admitting: Family

## 2024-02-23 ENCOUNTER — Ambulatory Visit: Payer: Self-pay | Admitting: Family

## 2024-02-23 ENCOUNTER — Telehealth: Payer: Self-pay

## 2024-02-23 ENCOUNTER — Encounter: Payer: Self-pay | Admitting: Family

## 2024-02-23 ENCOUNTER — Other Ambulatory Visit
Admission: RE | Admit: 2024-02-23 | Discharge: 2024-02-23 | Disposition: A | Discharge status: Home / Self Care | Source: Ambulatory Visit | Attending: Family | Admitting: Family

## 2024-02-23 VITALS — BP 130/59 | HR 66 | Wt 149.6 lb

## 2024-02-23 DIAGNOSIS — I5181 Takotsubo syndrome: Secondary | ICD-10-CM | POA: Insufficient documentation

## 2024-02-23 DIAGNOSIS — I428 Other cardiomyopathies: Secondary | ICD-10-CM | POA: Diagnosis not present

## 2024-02-23 DIAGNOSIS — E7800 Pure hypercholesterolemia, unspecified: Secondary | ICD-10-CM | POA: Diagnosis not present

## 2024-02-23 DIAGNOSIS — I10 Essential (primary) hypertension: Secondary | ICD-10-CM

## 2024-02-23 DIAGNOSIS — I471 Supraventricular tachycardia, unspecified: Secondary | ICD-10-CM | POA: Diagnosis not present

## 2024-02-23 LAB — BASIC METABOLIC PANEL WITH GFR
Anion gap: 10 (ref 5–15)
BUN: 17 mg/dL (ref 8–23)
CO2: 25 mmol/L (ref 22–32)
Calcium: 9.2 mg/dL (ref 8.9–10.3)
Chloride: 99 mmol/L (ref 98–111)
Creatinine, Ser: 0.78 mg/dL (ref 0.44–1.00)
GFR, Estimated: >60 mL/min (ref >60)
Glucose, Bld: 125 mg/dL — ABNORMAL HIGH (ref 70–99)
Potassium: 5.3 mmol/L — ABNORMAL HIGH (ref 3.5–5.1)
Sodium: 134 mmol/L — ABNORMAL LOW (ref 135–145)

## 2024-02-23 LAB — MAGNESIUM: Magnesium: 2.2 mg/dL (ref 1.7–2.4)

## 2024-02-23 MED ORDER — FUROSEMIDE 20 MG PO TABS: 20.0000 mg | ORAL_TABLET | Freq: Every day | ORAL | 3 refills | Status: AC | PRN

## 2024-02-23 MED ORDER — JARDIANCE 10 MG PO TABS: 10.0000 mg | ORAL_TABLET | Freq: Every day | ORAL | 5 refills | Status: DC

## 2024-02-23 NOTE — Telephone Encounter (Signed)
 Advanced Heart Failure Patient Advocate Encounter  Test billing for Farxiga and Jardiance  on this patients current coverage (Rx [facility]) shows that there is a $375 deductible remaining for 2025, with copays of $45 for 30 days, $135 for 90 days.  First fill of either medication would cost $420 for 30 day supply, following refills $45 per 30 days. Patient has indicated that she likely does not qualify for patient assistance.  Rachel DEL, CPhT Rx Patient Advocate Phone: [phone]

## 2024-02-23 NOTE — Patient Instructions (Addendum)
 It was a pleasure meeting you today.  Medication Changes:  Start Jardiance  10mg  tab daily Start Furosimide 20mg  tab daily as needed for swelling.  Lab Work:  Go over to the [facility]. Go pass the gift shop and have your blood work completed.  We will only call you if the results are abnormal or if the provider would like to make medication changes.   Thank you for coming in today  If you had labs drawn today, any labs that are abnormal the clinic will call you No news is good news  Follow-Up in: Please follow-up with the [facility] Clinic in 6 weeks with Ellouise Class, FNP.  At the [facility], you and your health needs are our priority. We have a designated team specialized in the treatment of Heart Failure. This Care Team includes your primary Heart Failure Specialized Cardiologist (physician), Advanced Practice Providers (APPs- Physician Assistants and Nurse Practitioners), and Pharmacist who all work together to provide you with the care you need, when you need it.   You may see any of the following providers on your designated Care Team at your next follow up:  Dr. Toribio Fuel Dr. Ezra Shuck Dr. Ria Commander Dr. Odis Brownie Ellouise Class, FNP Jaun Bash, RPH-CPP  Please be sure to bring in all your medications bottles to every appointment.   Need to Contact Us :  If you have any questions or concerns before your next appointment please send us  a message through [place] or call our office at [phone].    TO LEAVE A MESSAGE FOR THE NURSE SELECT OPTION 2, PLEASE LEAVE A MESSAGE INCLUDING: YOUR NAME DATE OF BIRTH CALL BACK NUMBER REASON FOR CALL**this is important as we prioritize the call backs  YOU WILL RECEIVE A CALL BACK THE SAME DAY AS LONG AS YOU CALL BEFORE 4:00 PM

## 2024-02-27 ENCOUNTER — Telehealth: Payer: Self-pay | Admitting: Pharmacist

## 2024-02-27 NOTE — Telephone Encounter (Signed)
 Patient reports feeling dizzy/lightheaded when rising from sitting or moving fast and general unsteadiness after starting Jardiance . BP at home has been 110-130s mmHg. Suspect this is due to hypovolemia given patient reports continuous weight loss since hospital discharge, so patient instructed to liberalize water intake today and call again tomorrow. May require transition to Farxiga if unable to tolerate Jardiance .

## 2024-02-28 NOTE — Progress Notes (Unsigned)
 Cardiology Office Note    Date:  02/29/2024   ID:  Laura Cohen Sep 08, 1955, MRN [id number]  PCP:  Loreli Kins, MD  Cardiologist:  Oneil Parchment, MD  Electrophysiologist:  None   Chief Complaint: Hospital follow-up  History of Present Illness:   Laura Cohen is a 68 y.o. female with history of Takotsubo cardiomyopathy, palpitations with atrial tachycardia/SVT, HTN, HLD, and remote brief tobacco use who presents for hospital follow-up as outlined below.  She was evaluated in our office in 09/2021 for palpitations. 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 Zio patch in 09/2021 showed a predominant rhythm of sinus with an average rate of 66 bpm, 1 run of NSVT lasting 6 beats with a rate of 118 bpm, 13 episodes of atrial tachycardia lasting up to 14 beats with an average rate of 124 bpm, and rare PACs and PVCs. Echo in 09/2021 showed an EF of 65 to 70%, no regional wall motion abnormalities, grade 1 diastolic dysfunction, normal RV systolic function, ventricular cavity size, and RVSP, trivial mitral regurgitation, and an estimated right atrial pressure of 3 mmHg.   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 This was followed by a sensation of diffuse attention and her muscles associated with chest tightness radiating to the back and tachypalpitations. Leading up to these events, she was without symptoms of angina or cardiac decompensation and had her typical baseline.  The symptoms persisted prompting her to present to [facility] where she was admitted with concern for NSTEMI initially with initial EKG showing sinus rhythm with possible prior anterior infarct, prolonged QT, and inferolateral T wave inversion.  Initial and peak high-sensitivity troponin 1187.  D-dimer negative.  Echo showed an EF of 30 to 35% with LV basilar hypokinesis with mid apical hypokinesis suggestive of stress-induced cardiomyopathy, normal  RV systolic function and ventricular cavity size, moderately elevated RVSP, mild mitral regurgitation, mild to moderate tricuspid regurgitation, and an estimated right atrial pressure of 8 mmHg.  LHC showed no angiographically significant CAD with moderately elevated LVEDP estimated at 27 mmHg.  Findings were consistent with Takotsubo cardiomyopathy.  Admission was also notable for runs of paroxysmal SVT requiring amiodarone  IV amiodarone  with transition to oral amiodarone  prior to discharge with plans to discontinue in the outpatient setting down the road.  Escalation of GDMT was limited by hypotension.  Zio patch was recommended to quantify SVT/atrial tach burden and remains pending at this time.  She followed up with the [facility] CHF clinic on 02/23/2024 with recommendation to start Jardiance  10 mg daily as well as furosemide  20 mg as needed.  She comes in today accompanied by her sister and husband.  She is without symptoms of angina or cardiac decompensation.  She reports she was doing well following her hospital discharge, though following initiation of Jardiance  she began to feel dizzy and lightheaded, also with some head fullness sensation.  In total, she took 4 days of Jardiance  with last dose being on 7/28.  She also notes a sensation of fullness in her ears and wonders if she has fluid buildup behind her ears.  She does continue to note intermittent paroxysms of palpitations, typically when laying down with less stimuli around her.  She notes ongoing fatigue.  No significant lower extremity swelling or progressive orthopnea.  She has not needed any recently started as needed furosemide .  Blood pressure at home has been  in the low 100s to 130s systolic with a rare reading in the 90s systolic.  She has not yet received the Zio patch.   Labs independently reviewed: 01/2024 - potassium 5.3, BUN 17, serum creatinine 0.78, magnesium  2.2, Hgb 14.1, PLT 283, TSH normal, BNP 870, TC 116, TG 107, HDL 60,  LDL 35, LP(a) 14.9  Past Medical History:  Diagnosis Date   Hematuria    Hemorrhoids    Hyperlipidemia    Hypertension    Insomnia    Menopausal symptoms    Osteopenia     Past Surgical History:  Procedure Laterality Date   ABDOMINAL HYSTERECTOMY     BREAST EXCISIONAL BIOPSY     BREAST SURGERY     LEFT HEART CATH AND CORONARY ANGIOGRAPHY N/A 02/17/2024   Procedure: LEFT HEART CATH AND CORONARY ANGIOGRAPHY;  Surgeon: Mady Bruckner, MD;  Location: [facility] INVASIVE CV LAB;  Service: Cardiovascular;  Laterality: N/A;    Current Medications: Current Meds  Medication Sig   acetaminophen  (TYLENOL ) 500 MG tablet Take 1,000 mg by mouth every 8 (eight) hours as needed for moderate pain (pain score 4-6).   amiodarone  (PACERONE ) 200 MG tablet Take 2 tablets (400 mg total) by mouth 2 (two) times daily for 7 days, THEN 1 tablet (200 mg total) 2 (two) times daily for 7 days, THEN 1 tablet (200 mg total) daily.   aspirin  EC 81 MG tablet Take 81 mg by mouth daily. Swallow whole.   B Complex Vitamins (VITAMIN B COMPLEX PO) Take 1 tablet by mouth daily.   BIOTIN PO Take 2 each by mouth daily.   co-enzyme Q-10 30 MG capsule Take 30 mg by mouth 3 (three) times daily.   furosemide  (LASIX ) 20 MG tablet Take 1 tablet (20 mg total) by mouth daily as needed.   metoprolol  succinate (TOPROL -XL) 25 MG 24 hr tablet Take 0.5 tablets (12.5 mg total) by mouth daily.   Misc Natural Products (TURMERIC, CURCUMIN, PO) Take 2 each by mouth daily.   Multiple Vitamin (MULTIVITAMIN) tablet Take 1 tablet by mouth daily.   rosuvastatin  (CRESTOR ) 5 MG tablet Take 5 mg by mouth daily.    Allergies:   Alendronate, Penicillins, Amoxicillin, Amoxicillin-pot clavulanate, and Jardiance  [empagliflozin ]   Social History   Socioeconomic History   Marital status: Married    Spouse name: Alm   Number of children: 3   Years of education: Not on file   Highest education level: Not on file  Occupational History   Not on  file  Tobacco Use   Smoking status: Never   Smokeless tobacco: Not on file  Substance and Sexual Activity   Alcohol use: No    Alcohol/week: 0.0 standard drinks of alcohol   Drug use: No   Sexual activity: Not on file  Other Topics Concern   Not on file  Social History Narrative   Lives with spouse in a home with a Dog (multipoo).   Social Drivers of Corporate investment banker Strain: Low Risk  (02/20/2024)   Overall Financial Resource Strain (CARDIA)    Difficulty of Paying Living Expenses: Not hard at all  Food Insecurity: No Food Insecurity (02/20/2024)   Hunger Vital Sign    Worried About Running Out of Food in the Last Year: Never true    Ran Out of Food in the Last Year: Never true  Transportation Needs: No Transportation Needs (02/20/2024)   PRAPARE - Administrator, Civil Service (Medical): No  Lack of Transportation (Non-Medical): No  Physical Activity: Not on file  Stress: Not on file  Social Connections: Socially Integrated (02/17/2024)   Social Connection and Isolation Panel    Frequency of Communication with Friends and Family: More than three times a week    Frequency of Social Gatherings with Friends and Family: Once a week    Attends Religious Services: More than 4 times per year    Active Member of [employer] or Organizations: Yes    Attends Engineer, structural: More than 4 times per year    Marital Status: Married     Family History:  The patient's family history includes Breast cancer in her cousin; CAD in her mother; Cancer in her sister; Dementia in her father; Hypertension in her father, mother, and another family member; Melanoma in her brother.  ROS:   12-point review of systems is negative unless otherwise noted in the HPI.   EKGs/Labs/Other Studies Reviewed:    Studies reviewed were summarized above. The additional studies were reviewed today:  LHC 02/17/2024: Conclusions: No angiographically significant coronary artery  disease.  Given severely reduced LVEF by echo with mid and apical wall-motion abnormality, findings are consistent with Takotsubo cardiomyopathy. Moderately elevated left ventricular filling pressure (LVEDP 27 mmHg).   Recommendations: Gentle diuresis and initiation of goal-directed medical therapy. Primary prevention of coronary artery disease. __________   2D echo 02/17/2024: 1. There is LV basilar hyperkinesis with mid-apical hypokinesis  suggesting stress-induced cardiomyopathy. Left ventricular ejection  fraction, by estimation, is 30 to 35%. The left ventricle has moderate to  severely decreased function. The left ventricle   demonstrates regional wall motion abnormalities (see scoring  diagram/findings for description). Left ventricular diastolic parameters  are indeterminate.   2. Right ventricular systolic function is normal. The right ventricular  size is normal. There is moderately elevated pulmonary artery systolic  pressure.   3. The mitral valve is normal in structure. Mild mitral valve  regurgitation.   4. Tricuspid valve regurgitation is mild to moderate.   5. The aortic valve is tricuspid. Aortic valve regurgitation is not  visualized.   6. The inferior vena cava is normal in size with <50% respiratory  variability, suggesting right atrial pressure of 8 mmHg.    EKG:  EKG is ordered today.  The EKG ordered today demonstrates NSR, 60 bpm, global T wave inversion and prolonged QT consistent with prior tracing  Recent Labs: 02/17/2024: B Natriuretic Peptide 870.3; TSH 1.998 02/20/2024: Hemoglobin 14.1; Platelets 283 02/23/2024: BUN 17; Creatinine, Ser 0.78; Magnesium  2.2; Potassium 5.3; Sodium 134  Recent Lipid Panel    Component Value Date/Time   CHOL 116 02/17/2024 0337   TRIG 107 02/17/2024 0337   HDL 60 02/17/2024 0337   CHOLHDL 1.9 02/17/2024 0337   VLDL 21 02/17/2024 0337   LDLCALC 35 02/17/2024 0337    PHYSICAL EXAM:    VS:  BP 136/82   Pulse 60   Ht  5' 3 (1.6 m)   Wt 145 lb 6.4 oz (66 kg)   SpO2 99%   BMI 25.76 kg/m   BMI: Body mass index is 25.76 kg/m.  Physical Exam Vitals reviewed.  Constitutional:      Appearance: She is well-developed.  HENT:     Head: Normocephalic and atraumatic.  Eyes:     General:        Right eye: No discharge.        Left eye: No discharge.  Cardiovascular:     Rate  and Rhythm: Normal rate and regular rhythm.     Heart sounds: Normal heart sounds, S1 normal and S2 normal. Heart sounds not distant. No midsystolic click and no opening snap. No murmur heard.    No friction rub.  Pulmonary:     Effort: Pulmonary effort is normal. No respiratory distress.     Breath sounds: Normal breath sounds. No decreased breath sounds, wheezing, rhonchi or rales.  Musculoskeletal:     Cervical back: Normal range of motion.  Skin:    General: Skin is warm and dry.     Nails: There is no clubbing.  Neurological:     Mental Status: She is alert and oriented to person, place, and time.  Psychiatric:        Speech: Speech normal.        Behavior: Behavior normal.        Thought Content: Thought content normal.        Judgment: Judgment normal.     Wt Readings from Last 3 Encounters:  02/29/24 145 lb 6.4 oz (66 kg)  02/23/24 149 lb 9.6 oz (67.9 kg)  02/17/24 155 lb 3.3 oz (70.4 kg)    Orthostatic VS for the past 24 hrs:  BP- Lying Pulse- Lying BP- Sitting Pulse- Sitting BP- Standing at 0 minutes Pulse- Standing at 0 minutes  02/29/24 1122 154/76 64 163/73 65 163/78 66     ASSESSMENT & PLAN:   Takotsubo cardiomyopathy: Euvolemic and well compensated.  She did not tolerate the addition of Jardiance  secondary to dizziness/lightheadedness, particular with unsteady changes felt to be possibly hypovolemia, though can also not exclude episodes of hypoglycemia as well.  Blood pressures at home have largely been in the low 100s to 130s systolic with a rare 90s systolic.  Blood pressure was also soft in the  hospital limiting escalation of GDMT.  Given sensitivity to medication, and with relative hypotension we will defer escalation of GDMT at this time with continuation of Toprol -XL 12.5 mg.  She has not needed any as needed furosemide .  Obtain limited echo in approximately 4 weeks to reevaluate LV systolic function.  If cardiomyopathy persist at that time, pending BP trend, would look to add low-dose ARB versus ARNI.  Avoid SGLT2 inhibitor at this time.  PSVT: Likely in the setting of her stress-induced cardiomyopathy.  Continues to note paroxysms of tachypalpitations.  She has not yet received Zio patch.  We will see if we can place Zio patch in the office today, if not this will need to be reordered.  For now, she remains on Toprol -XL 12.5 mg daily, though this may be contributing to some underlying fatigue.  She can take Toprol  in the evening hours with recommendation to transition to this dosing on 7/31.  For now, she remains on amiodarone  with typical taper.  Anticipate discontinuing amiodarone  in the next several weeks to couple months max.  Monitor QT on amiodarone .  TSH normal earlier this month.  HTN: Blood pressure  HLD: LDL 35 with no evidence of CAD on LHC.  Remains on rosuvastatin  5 mg.  Hyperkalemia: Potassium 5.3 on 02/23/2024.  Check BMP.  Head fullness: Appears to be consistent with eustachian tube dysfunction.  OTC Claritin/Zyrtec and Flonase could be used.     Disposition: F/u with Dr. Jeffrie or an APP in 5-6 weeks.   Medication Adjustments/Labs and Tests Ordered: Current medicines are reviewed at length with the patient today.  Concerns regarding medicines are outlined above. Medication changes, Labs and Tests  ordered today are summarized above and listed in the Patient Instructions accessible in Encounters.   Signed, Bernardino Bring, PA-C 02/29/2024 4:52 PM     [facility] [place] [address] [place], [place] [address] [phone]

## 2024-02-29 ENCOUNTER — Other Ambulatory Visit (INDEPENDENT_AMBULATORY_CARE_PROVIDER_SITE_OTHER): Payer: Self-pay

## 2024-02-29 ENCOUNTER — Encounter: Payer: Self-pay | Admitting: Physician Assistant

## 2024-02-29 ENCOUNTER — Ambulatory Visit: Attending: Physician Assistant | Admitting: Physician Assistant

## 2024-02-29 VITALS — BP 136/82 | HR 60 | Ht 63.0 in | Wt 145.4 lb

## 2024-02-29 DIAGNOSIS — I10 Essential (primary) hypertension: Secondary | ICD-10-CM | POA: Diagnosis not present

## 2024-02-29 DIAGNOSIS — Z79899 Other long term (current) drug therapy: Secondary | ICD-10-CM

## 2024-02-29 DIAGNOSIS — E782 Mixed hyperlipidemia: Secondary | ICD-10-CM

## 2024-02-29 DIAGNOSIS — E875 Hyperkalemia: Secondary | ICD-10-CM

## 2024-02-29 DIAGNOSIS — I471 Supraventricular tachycardia, unspecified: Secondary | ICD-10-CM | POA: Diagnosis not present

## 2024-02-29 DIAGNOSIS — I5181 Takotsubo syndrome: Secondary | ICD-10-CM | POA: Diagnosis not present

## 2024-02-29 NOTE — Patient Instructions (Addendum)
 Medication Instructions:  Your physician recommends that you continue on your current medications as directed. Please refer to the Current Medication list given to you today.   *If you need a refill on your cardiac medications before your next appointment, please call your pharmacy*  Lab Work: Your provider would like for you to have following labs drawn today BMP.   If you have labs (blood work) drawn today and your tests are completely normal, you will receive your results only by: MyChart Message (if you have MyChart) OR A paper copy in the mail If you have any lab test that is abnormal or we need to change your treatment, we will call you to review the results.  Testing/Procedures: Your physician has recommended that you wear a Zio monitor.   This monitor is a medical device that records the heart's electrical activity. Doctors most often use these monitors to diagnose arrhythmias. Arrhythmias are problems with the speed or rhythm of the heartbeat. The monitor is a small device applied to your chest. You can wear one while you do your normal daily activities. While wearing this monitor if you have any symptoms to push the button and record what you felt. Once you have worn this monitor for the period of time provider prescribed (Usually 14 days), you will return the monitor device in the postage paid box. Once it is returned they will download the data collected and provide us  with a report which the provider will then review and we will call you with those results. Important tips:  Avoid showering during the first 24 hours of wearing the monitor. Avoid excessive sweating to help maximize wear time. Do not submerge the device, no hot tubs, and no swimming pools. Keep any lotions or oils away from the patch. After 24 hours you may shower with the patch on. Take brief showers with your back facing the shower head.  Do not remove patch once it has been placed because that will interrupt data  and decrease adhesive wear time. Push the button when you have any symptoms and write down what you were feeling. Once you have completed wearing your monitor, remove and place into box which has postage paid and place in your outgoing mailbox.  If for some reason you have misplaced your box then call our office and we can provide another box and/or mail it off for you.   Your physician has requested that you have an LIMITED echocardiogram. Echocardiography is a painless test that uses sound waves to create images of your heart. It provides your doctor with information about the size and shape of your heart and how well your heart's chambers and valves are working.   You may receive an ultrasound enhancing agent through an IV if needed to better visualize your heart during the echo. This procedure takes approximately one hour.  There are no restrictions for this procedure.  This will take place at [address] [facility] [address], [place] [address]  Please note: We ask at that you not bring children with you during ultrasound (echo/ vascular) testing. Due to room size and safety concerns, children are not allowed in the ultrasound rooms during exams. Our front office staff cannot provide observation of children in our lobby area while testing is being conducted. An adult accompanying a patient to their appointment will only be allowed in the ultrasound room at the discretion of the ultrasound technician under special circumstances. We apologize for any inconvenience.   Follow-Up:  At [facility], you and your health needs are our priority.  As part of our continuing mission to provide you with exceptional heart care, our providers are all part of one team.  This team includes your primary Cardiologist (physician) and Advanced Practice Providers or APPs (Physician Assistants and Nurse Practitioners) who all work together to provide you with the care you need, when you need  it.  Your next appointment:   5-6 week(s)  Provider:   You may see Oneil Parchment, MD or Bernardino Bring, PA-C

## 2024-03-01 ENCOUNTER — Ambulatory Visit: Payer: Self-pay | Admitting: Physician Assistant

## 2024-03-01 DIAGNOSIS — Z79899 Other long term (current) drug therapy: Secondary | ICD-10-CM

## 2024-03-01 LAB — BASIC METABOLIC PANEL WITH GFR
BUN/Creatinine Ratio: 14 (ref 12–28)
BUN: 10 mg/dL (ref 8–27)
CO2: 20 mmol/L (ref 20–29)
Calcium: 9.6 mg/dL (ref 8.7–10.3)
Chloride: 94 mmol/L — ABNORMAL LOW (ref 96–106)
Creatinine, Ser: 0.72 mg/dL (ref 0.57–1.00)
Glucose: 105 mg/dL — ABNORMAL HIGH (ref 70–99)
Potassium: 4.5 mmol/L (ref 3.5–5.2)
Sodium: 133 mmol/L — ABNORMAL LOW (ref 134–144)
eGFR: 91 mL/min/1.73 (ref >59)

## 2024-03-19 ENCOUNTER — Emergency Department
Admission: EM | Admit: 2024-03-19 | Discharge: 2024-03-19 | Disposition: A | Discharge status: Home / Self Care | Attending: Emergency Medicine | Admitting: Emergency Medicine

## 2024-03-19 ENCOUNTER — Other Ambulatory Visit: Payer: Self-pay

## 2024-03-19 DIAGNOSIS — I10 Essential (primary) hypertension: Secondary | ICD-10-CM | POA: Diagnosis not present

## 2024-03-19 DIAGNOSIS — Z79899 Other long term (current) drug therapy: Secondary | ICD-10-CM | POA: Insufficient documentation

## 2024-03-19 LAB — CBC WITH DIFFERENTIAL/PLATELET
Abs Immature Granulocytes: 0.06 K/uL (ref 0.00–0.07)
Basophils Absolute: 0.1 K/uL (ref 0.0–0.1)
Basophils Relative: 1 %
Eosinophils Absolute: 0.1 K/uL (ref 0.0–0.5)
Eosinophils Relative: 1 %
HCT: 44.7 % (ref 36.0–46.0)
Hemoglobin: 14.7 g/dL (ref 12.0–15.0)
Immature Granulocytes: 1 %
Lymphocytes Relative: 10 %
Lymphs Abs: 1.2 K/uL (ref 0.7–4.0)
MCH: 29.4 pg (ref 26.0–34.0)
MCHC: 32.9 g/dL (ref 30.0–36.0)
MCV: 89.4 fL (ref 80.0–100.0)
Monocytes Absolute: 0.7 K/uL (ref 0.1–1.0)
Monocytes Relative: 6 %
Neutro Abs: 10.7 K/uL — ABNORMAL HIGH (ref 1.7–7.7)
Neutrophils Relative %: 81 %
Platelets: 241 K/uL (ref 150–400)
RBC: 5 MIL/uL (ref 3.87–5.11)
RDW: 12.5 % (ref 11.5–15.5)
WBC: 12.9 K/uL — ABNORMAL HIGH (ref 4.0–10.5)
nRBC: 0 % (ref 0.0–0.2)

## 2024-03-19 LAB — BASIC METABOLIC PANEL WITH GFR
Anion gap: 12 (ref 5–15)
BUN: 10 mg/dL (ref 8–23)
CO2: 23 mmol/L (ref 22–32)
Calcium: 9.2 mg/dL (ref 8.9–10.3)
Chloride: 98 mmol/L (ref 98–111)
Creatinine, Ser: 0.54 mg/dL (ref 0.44–1.00)
GFR, Estimated: >60 mL/min (ref >60)
Glucose, Bld: 107 mg/dL — ABNORMAL HIGH (ref 70–99)
Potassium: 3.7 mmol/L (ref 3.5–5.1)
Sodium: 133 mmol/L — ABNORMAL LOW (ref 135–145)

## 2024-03-19 MED ORDER — ACETAMINOPHEN 325 MG PO TABS
650.0000 mg | ORAL_TABLET | Freq: Once | ORAL | Status: AC
  Administered 2024-03-19: 650 mg via ORAL
  Filled 2024-03-19: qty 2

## 2024-03-19 NOTE — ED Triage Notes (Signed)
 Pt sts that she was admitted with broken heart syndrom a few weeks ago and today she was doing things and started to have high blood pressure and is here to get it checked out.

## 2024-03-19 NOTE — ED Provider Notes (Signed)
 [facility] Emergency Department Provider Note     Event Date/Time   First MD Initiated Contact with Patient 03/19/24 2047     (approximate)   History   Hypertension   HPI  Laura Cohen is a 68 y.o. female with a history of HTN, SVT, NSTEMI, and Takotsubo cardiomyopathy presents to the ED for evaluation of elevated blood pressure readings.  Patient speculates that symptoms have been brought on by dealing with her mother's estate over the last couple days.  She reports systolic blood pressure readings in the 190s at home.  No associated chest pain, shortness of breath, cough, or diaphoresis.  Patient has been taking her metoprolol  and amiodarone  as prescribed.  She presents to the ED for evaluation of her symptoms.  Physical Exam   Triage Vital Signs: ED Triage Vitals  Encounter Vitals Group     BP 03/19/24 1853 (!) 193/69     Girls Systolic BP Percentile --      Girls Diastolic BP Percentile --      Boys Systolic BP Percentile --      Boys Diastolic BP Percentile --      Pulse Rate 03/19/24 1853 72     Resp 03/19/24 1853 17     Temp 03/19/24 1853 98.1 F (36.7 C)     Temp Source 03/19/24 1853 Oral     SpO2 03/19/24 1853 99 %     Weight 03/19/24 1854 145 lb 6.4 oz (66 kg)     Height 03/19/24 1854 5' 3 (1.6 m)     Head Circumference --      Peak Flow --      Pain Score 03/19/24 1854 0     Pain Loc --      Pain Education --      Exclude from Growth Chart --     Most recent vital signs: Vitals:   03/19/24 2201 03/19/24 2230  BP: (!) 154/65 (!) 181/64  Pulse: 66 67  Resp: 16 16  Temp:    SpO2: 99% 98%    General Awake, no distress. NAD HEENT NCAT. PERRL. EOMI. No rhinorrhea. Mucous membranes are moist. CV:  Good peripheral perfusion. RRR.  No CCE distally RESP:  Normal effort. CTA ABD:  No distention.  MSK:  AROM of all extremities   ED Results / Procedures / Treatments   Labs (all labs ordered are listed, but only abnormal  results are displayed) Labs Reviewed  BASIC METABOLIC PANEL WITH GFR - Abnormal; Notable for the following components:      Result Value   Sodium 133 (*)    Glucose, Bld 107 (*)    All other components within normal limits  CBC WITH DIFFERENTIAL/PLATELET - Abnormal; Notable for the following components:   WBC 12.9 (*)    Neutro Abs 10.7 (*)    All other components within normal limits     EKG  Vent. rate 59 BPM PR interval 144 ms QRS duration 92 ms QT/QTcB 530/524 ms P-R-T axes 41 -9 204 Sinus bradycardia ST & T wave abnormality, consider inferior ischemia ST & T wave abnormality, consider anterolateral ischemia Prolonged QT Abnormal ECG When compared with ECG of 29-Feb-2024 10:02, No significant change was found  RADIOLOGY  No results found.   PROCEDURES:  Critical Care performed: No  Procedures   MEDICATIONS ORDERED IN ED: Medications  acetaminophen  (TYLENOL ) tablet 650 mg (650 mg Oral Given 03/19/24 2226)     IMPRESSION / MDM /  ASSESSMENT AND PLAN / ED COURSE  I reviewed the triage vital signs and the nursing notes.                              Differential diagnosis includes, but is not limited to,  ACS, aortic dissection, pulmonary embolism, cardiac tamponade, pneumothorax, pneumonia, pericarditis, myocarditis, GI-related causes including esophagitis/gastritis, and musculoskeletal chest wall pain.    Patient's presentation is most consistent with acute complicated illness / injury requiring diagnostic workup.  Patient's diagnosis is consistent with elevated blood pressure readings likely a stress response.  Patient with reassuring exam and workup overall.  No reports of any acute chest pain, shortness of breath, diaphoresis.  Patient's labs are reassuring with no significant lab abnormalities noted, and consistent with recent baseline.  EKG without evidence of acute abnormality, and unchanged from recent EKG.  No indication at this presentation to make any  adjustments to the patient's medication as she is closely followed by cardiology and CHF clinic.  Patient will be discharged home with instructions to take her home meds as prescribed. Patient is to follow up with her specialty providers as suggested, as needed or otherwise directed. Patient is given ED precautions to return to the ED for any worsening or new symptoms.   FINAL CLINICAL IMPRESSION(S) / ED DIAGNOSES   Final diagnoses:  Hypertension, unspecified type     Rx / DC Orders   ED Discharge Orders     None        Note:  This document was prepared using Dragon voice recognition software and may include unintentional dictation errors.    Loyd Candida LULLA Aldona, PA-C 03/19/24 2350    Dorothyann Drivers, MD 03/20/24 2200

## 2024-03-19 NOTE — Discharge Instructions (Signed)
 Your exam, labs, and EKG are overall reassuring and unchanged.  You have had some elevated systolic blood pressure readings.  You should continue to monitor your blood pressure daily as suggested.  Follow-up with your primary provider or cardiology specialist for ongoing management.  Return to the ED if needed.

## 2024-03-20 ENCOUNTER — Telehealth: Payer: Self-pay | Admitting: Cardiology

## 2024-03-20 MED ORDER — LOSARTAN POTASSIUM 25 MG PO TABS: 25.0000 mg | ORAL_TABLET | Freq: Every day | ORAL | 3 refills | Status: DC

## 2024-03-20 NOTE — Telephone Encounter (Signed)
 Asking that he dr review her ER visit notes and see if she needs to make changes to any of her medication. Please advise

## 2024-03-20 NOTE — Telephone Encounter (Signed)
 Please see mychart encounter

## 2024-03-21 DIAGNOSIS — I471 Supraventricular tachycardia, unspecified: Secondary | ICD-10-CM | POA: Diagnosis not present

## 2024-03-23 DIAGNOSIS — I471 Supraventricular tachycardia, unspecified: Secondary | ICD-10-CM

## 2024-03-28 DIAGNOSIS — Z79899 Other long term (current) drug therapy: Secondary | ICD-10-CM | POA: Diagnosis not present

## 2024-03-29 LAB — BASIC METABOLIC PANEL WITH GFR
BUN/Creatinine Ratio: 12 (ref 12–28)
BUN: 9 mg/dL (ref 8–27)
CO2: 22 mmol/L (ref 20–29)
Calcium: 10.1 mg/dL (ref 8.7–10.3)
Chloride: 100 mmol/L (ref 96–106)
Creatinine, Ser: 0.75 mg/dL (ref 0.57–1.00)
Glucose: 100 mg/dL — ABNORMAL HIGH (ref 70–99)
Potassium: 4.8 mmol/L (ref 3.5–5.2)
Sodium: 137 mmol/L (ref 134–144)
eGFR: 87 mL/min/1.73 (ref >59)

## 2024-03-30 NOTE — Telephone Encounter (Signed)
 Spoke with patient and concern regarding follow up was discussed.  Blood pressure since just starting titrated dose of losartan  50 mg has trended to the 140s to 160s mmHg systolic.  She will continue to monitor her blood pressure and update us  early next week.  She will follow-up in our office.

## 2024-04-03 ENCOUNTER — Other Ambulatory Visit: Payer: Self-pay

## 2024-04-05 ENCOUNTER — Encounter: Admitting: Family

## 2024-04-06 ENCOUNTER — Other Ambulatory Visit: Payer: Self-pay | Admitting: Cardiology

## 2024-04-06 MED ORDER — LOSARTAN POTASSIUM 50 MG PO TABS: 50.0000 mg | ORAL_TABLET | Freq: Two times a day (BID) | ORAL | 3 refills | Status: DC

## 2024-04-06 NOTE — Telephone Encounter (Signed)
 Pt c/o BP issue: STAT if pt c/o blurred vision, one-sided weakness or slurred speech  1. What are your last 5 BP readings? 144/61 RH 60; 148/67 HR 62; 146/71 HR 60; 162/58 HR 58; 152/66 HR 58; 157/67 HR 61; 139/68  2. Are you having any other symptoms (ex. Dizziness, headache, blurred vision, passed out)? Headaches   3. What is your BP issue?

## 2024-04-06 NOTE — Telephone Encounter (Signed)
 I am okay if she wants to take losartan  50 mg twice daily.

## 2024-04-06 NOTE — Telephone Encounter (Signed)
 Reviewed recommendations from provider and she now wants to know if she should take 1/2 in the AM and 1/2 in the PM. Advised that I would check with provider and be in touch.

## 2024-04-06 NOTE — Telephone Encounter (Signed)
 Patient calling with elevated BP readings. All of these are after medications. Reports her medications are taken at 8 am and losartan  is 8 pm. She also states that she took her last dose of amiodarone  yesterday. She reports sending BP log yesterday or day before. I did not see this in her chart. She does report daily headaches in the afternoon and some neck pain which she takes tylenol  daily for the last week or so.   She reports that at last visit Bernardino had mentioned possibly increasing losartan . So she wanted to know his recommendations. Pt states that she is concerned and does not want to go over the weekend.  Advised that I would forward this message over to Bernardino Bring PA-C for his review and recommendations. She verbalized understanding with no further questions at this time.

## 2024-04-06 NOTE — Telephone Encounter (Signed)
 Spoke with patient and notified that updated prescription has been sent to her pharmacy. She was appreciative

## 2024-04-06 NOTE — Telephone Encounter (Signed)
 Patient currently taking losartan  50 mg and Toprol -XL 12.5 mg daily.  Increase losartan  to 100 mg daily.  Follow-up as scheduled later this month.  Repeat labs will be obtained at that time.

## 2024-04-09 DIAGNOSIS — F411 Generalized anxiety disorder: Secondary | ICD-10-CM | POA: Diagnosis not present

## 2024-04-09 DIAGNOSIS — I5181 Takotsubo syndrome: Secondary | ICD-10-CM | POA: Diagnosis not present

## 2024-04-09 DIAGNOSIS — I471 Supraventricular tachycardia, unspecified: Secondary | ICD-10-CM | POA: Diagnosis not present

## 2024-04-09 DIAGNOSIS — I10 Essential (primary) hypertension: Secondary | ICD-10-CM | POA: Diagnosis not present

## 2024-04-12 ENCOUNTER — Encounter: Payer: Self-pay | Admitting: Family

## 2024-04-15 ENCOUNTER — Telehealth: Payer: Self-pay | Admitting: Cardiology

## 2024-04-15 ENCOUNTER — Telehealth: Payer: Self-pay | Admitting: Student in an Organized Health Care Education/Training Program

## 2024-04-15 NOTE — Telephone Encounter (Signed)
 Received a call to the on-call cardiology pager about elevated blood pressure. She has spoken with Artist earlier in the day and he had instructed her to take additional metoprolol  if her blood pressure remained elevated. Laura Cohen had called back to obtain further instruction on what her BP should decrease to after taking the additional metoprolol . I instructed her to contact her team in the morning for their suggestions on blood pressure control. She was just increased to losartan  100 mg daily 10 days earlier.   If blood pressure is above 200/120 I instructed [name] to present to the ED.   Merlene Blood, MD MS  Cardiology [facility]

## 2024-04-15 NOTE — Telephone Encounter (Signed)
  Patient called this afternoon for advice regarding elevated BP. She reports that her BP has been going up since Saturday, attributes some of this to stress related to cleaning out her mom's house since her passing earlier this year. Saw PCP this week who gave patient Rx for low dose Valium to use as needed for anxiety. Per patient, this morning BP was 142/2mmHg, HR 58. This afternoon 189/86 mmHg, HR 67. She has a mild headache and ringing in her ears, says this always happens with elevated BP. Denies chest pain or dyspnea. Encouraged patient to take Valium as prescribed by her PCP if she feels that stress is particularly bad today. Reminded her that she should not drive after taking. I also advised her that she could take another 12.5mg  of her Toprol  XL this evening if BP remains elevated. I have requested that she keep a log of BP this week and send to [facility] office. Reviewed emergency precautions with patient.  Artist Pouch, PA-C

## 2024-04-16 ENCOUNTER — Other Ambulatory Visit: Payer: Self-pay | Admitting: Emergency Medicine

## 2024-04-16 ENCOUNTER — Other Ambulatory Visit (HOSPITAL_COMMUNITY): Payer: Self-pay

## 2024-04-16 ENCOUNTER — Other Ambulatory Visit: Payer: Self-pay

## 2024-04-16 MED ORDER — VALSARTAN 320 MG PO TABS
320.0000 mg | ORAL_TABLET | Freq: Every day | ORAL | 3 refills | Status: DC
  Filled 2024-04-16: qty 90, 90d supply, fill #0

## 2024-04-16 MED ORDER — AMLODIPINE BESYLATE 10 MG PO TABS
5.0000 mg | ORAL_TABLET | Freq: Every day | ORAL | 3 refills | Status: DC
  Filled 2024-04-16: qty 45, 90d supply, fill #0

## 2024-04-16 NOTE — Telephone Encounter (Signed)
 Change losartan  to valsartan  320 mg daily (stronger medication) and add amlodipine  10 mg, take 5 mg daily, may increase to 10 mg daily if BP runs greater than 140/90 after 1 week.

## 2024-04-16 NOTE — Telephone Encounter (Signed)
 See phone note

## 2024-04-16 NOTE — Addendum Note (Signed)
 Addended by: HANNAH MARCUS KIDD on: 04/16/2024 06:04 PM   Modules accepted: Orders

## 2024-04-17 ENCOUNTER — Other Ambulatory Visit: Payer: Self-pay

## 2024-04-17 ENCOUNTER — Ambulatory Visit: Attending: Physician Assistant

## 2024-04-17 DIAGNOSIS — H5200 Hypermetropia, unspecified eye: Secondary | ICD-10-CM | POA: Diagnosis not present

## 2024-04-17 DIAGNOSIS — H35033 Hypertensive retinopathy, bilateral: Secondary | ICD-10-CM | POA: Diagnosis not present

## 2024-04-17 DIAGNOSIS — I471 Supraventricular tachycardia, unspecified: Secondary | ICD-10-CM | POA: Diagnosis not present

## 2024-04-17 DIAGNOSIS — H2513 Age-related nuclear cataract, bilateral: Secondary | ICD-10-CM | POA: Diagnosis not present

## 2024-04-17 LAB — ECHOCARDIOGRAM LIMITED
AV Mean grad: 5 mmHg
AV Peak grad: 10 mmHg
Ao pk vel: 1.58 m/s
Area-P 1/2: 3.99 cm2
S' Lateral: 3.34 cm

## 2024-04-18 ENCOUNTER — Other Ambulatory Visit (HOSPITAL_COMMUNITY): Payer: Self-pay

## 2024-04-18 ENCOUNTER — Other Ambulatory Visit: Payer: Self-pay

## 2024-04-18 MED ORDER — AMLODIPINE BESYLATE 10 MG PO TABS: 5.0000 mg | ORAL_TABLET | Freq: Every day | ORAL | 3 refills | Status: AC

## 2024-04-18 MED ORDER — VALSARTAN 320 MG PO TABS: 320.0000 mg | ORAL_TABLET | Freq: Every day | ORAL | 3 refills | Status: AC

## 2024-04-18 NOTE — Addendum Note (Signed)
 Addended by: CRISTOPHER OLIVIA PARAS on: 04/18/2024 10:19 AM   Modules accepted: Orders

## 2024-04-19 ENCOUNTER — Other Ambulatory Visit

## 2024-04-24 ENCOUNTER — Encounter: Payer: Self-pay | Admitting: Physician Assistant

## 2024-04-24 ENCOUNTER — Ambulatory Visit: Attending: Physician Assistant | Admitting: Physician Assistant

## 2024-04-24 VITALS — BP 190/80 | HR 66 | Ht 63.0 in | Wt 136.0 lb

## 2024-04-24 DIAGNOSIS — I5032 Chronic diastolic (congestive) heart failure: Secondary | ICD-10-CM

## 2024-04-24 DIAGNOSIS — I10 Essential (primary) hypertension: Secondary | ICD-10-CM | POA: Diagnosis not present

## 2024-04-24 DIAGNOSIS — I5181 Takotsubo syndrome: Secondary | ICD-10-CM | POA: Diagnosis not present

## 2024-04-24 DIAGNOSIS — I471 Supraventricular tachycardia, unspecified: Secondary | ICD-10-CM | POA: Diagnosis not present

## 2024-04-24 DIAGNOSIS — E782 Mixed hyperlipidemia: Secondary | ICD-10-CM

## 2024-04-24 NOTE — Progress Notes (Signed)
 Cardiology Office Note    Date:  04/24/2024   ID:  Laura, Cohen Dec 27, 1955, MRN [id number]  PCP:  Loreli Kins, MD  Cardiologist:  Oneil Parchment, MD  Electrophysiologist:  None   Chief Complaint: Follow up  History of Present Illness:   Laura Cohen is a 68 y.o. 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   She was evaluated in our office in 09/2021 for palpitations. 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 Zio patch in 09/2021 showed a predominant rhythm of sinus with an average rate of 66 bpm, 1 run of NSVT lasting 6 beats with a rate of 118 bpm, 13 episodes of atrial tachycardia lasting up to 14 beats with an average rate of 124 bpm, and rare PACs and PVCs. Echo in 09/2021 showed an EF of 65 to 70%, no regional wall motion abnormalities, grade 1 diastolic dysfunction, normal RV systolic function, ventricular cavity size, and RVSP, trivial mitral regurgitation, and an estimated right atrial pressure of 3 mmHg.    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 This was followed by a sensation of diffuse attention and her muscles associated with chest tightness radiating to the back and tachypalpitations. Leading up to these events, she was without symptoms of angina or cardiac decompensation and had her typical baseline.  The symptoms persisted prompting her to present to [facility] where she was admitted with concern for NSTEMI initially with initial EKG showing sinus rhythm with possible prior anterior infarct, prolonged QT, and inferolateral T wave inversion.  Initial and peak high-sensitivity troponin 1187.  D-dimer negative.  Echo showed an EF of 30 to 35% with LV basilar hypokinesis with mid apical hypokinesis suggestive of stress-induced cardiomyopathy,  normal RV systolic function and ventricular cavity size, moderately elevated RVSP, mild mitral regurgitation, mild to moderate tricuspid regurgitation, and an estimated right atrial pressure of 8 mmHg.  LHC showed no angiographically significant CAD with moderately elevated LVEDP estimated at 27 mmHg.  Findings were consistent with Takotsubo cardiomyopathy.  Admission was also notable for runs of paroxysmal SVT requiring amiodarone  IV amiodarone  with transition to oral amiodarone .  Escalation of GDMT was limited by hypotension.   She was seen in hospital follow-up on 02/29/2024 and was without symptoms of angina or cardiac decompensation.  She did report some head fullness and dizziness after having been started on Jardiance , leading to self discontinuation.  Zio patch showed a predominant rhythm of sinus with an average rate of 51 bpm with 4 episodes of SVT lasting up to 13.4 seconds.  She was seen in the ED on 03/19/2024 with elevated blood pressure into the 190s mmHg systolic at home leading to several adjustments in antihypertensive therapy thereafter.  Echo on 04/17/2024 showed normalization of LV systolic function with an EF of 55 to 60%, grade 1 diastolic dysfunction, normal RV systolic function and ventricular cavity size, and an estimated right atrial pressure of 3 mmHg.  She comes in accompanied by family today and is doing well from a cardiac perspective.  No symptoms of angina or cardiac decompensation.  Blood pressure has been much better controlled at home in the 130s to 1 teens mmHg systolic since transitioning from losartan  to valsartan  and with the addition of amlodipine  5 mg.  She does continue to  note some postnasal drip and head fullness as well as xerostomia.  No significant lower extremity swelling or progressive orthopnea.  Monitoring sodium intake.  Weight is down 9 pounds today when compared to last visit in the setting of attempting to follow heart healthy and low-sodium diet.   Labs  independently reviewed: 03/2024 - BUN 9, serum creatinine 0.75, potassium 4.8, Hgb 14.7, PLT 241 01/2024 - magnesium  2.2, TSH normal, BNP 870, TC 116, TG 107, HDL 60, LDL 35, LP(a) 14.9   Past Medical History:  Diagnosis Date   Hematuria    Hemorrhoids    Hyperlipidemia    Hypertension    Insomnia    Menopausal symptoms    Osteopenia     Past Surgical History:  Procedure Laterality Date   ABDOMINAL HYSTERECTOMY     BREAST EXCISIONAL BIOPSY     BREAST SURGERY     LEFT HEART CATH AND CORONARY ANGIOGRAPHY N/A 02/17/2024   Procedure: LEFT HEART CATH AND CORONARY ANGIOGRAPHY;  Surgeon: Mady Bruckner, MD;  Location: [facility] INVASIVE CV LAB;  Service: Cardiovascular;  Laterality: N/A;    Current Medications: Current Meds  Medication Sig   acetaminophen  (TYLENOL ) 500 MG tablet Take 1,000 mg by mouth every 8 (eight) hours as needed for moderate pain (pain score 4-6).   amLODipine  (NORVASC ) 10 MG tablet Take 0.5 tablets (5 mg total) by mouth daily.   aspirin  EC 81 MG tablet Take 81 mg by mouth daily. Swallow whole.   B Complex Vitamins (VITAMIN B COMPLEX PO) Take 1 tablet by mouth daily.   BIOTIN PO Take 2 each by mouth daily.   co-enzyme Q-10 30 MG capsule Take 30 mg by mouth 3 (three) times daily.   diazepam (VALIUM) 2 MG tablet Take 2 mg by mouth as needed for anxiety.   furosemide  (LASIX ) 20 MG tablet Take 1 tablet (20 mg total) by mouth daily as needed.   glucosamine-chondroitin 500-400 MG tablet Take 1 tablet by mouth 3 (three) times daily. (Patient taking differently: Take 1 tablet by mouth as needed.)   losartan  (COZAAR ) 50 MG tablet Take 1 tablet (50 mg total) by mouth 2 (two) times daily.   metoprolol  succinate (TOPROL -XL) 25 MG 24 hr tablet Take 0.5 tablets (12.5 mg total) by mouth daily.   Misc Natural Products (TURMERIC, CURCUMIN, PO) Take 2 each by mouth daily.   Multiple Vitamin (MULTIVITAMIN) tablet Take 1 tablet by mouth daily.   Omega-3 Fatty Acids (FISH OIL PO) Take 1  capsule by mouth daily. (Patient taking differently: Take 1 capsule by mouth as needed.)   rosuvastatin  (CRESTOR ) 5 MG tablet Take 5 mg by mouth daily.   sertraline (ZOLOFT) 50 MG tablet Take 50 mg by mouth daily.   valsartan  (DIOVAN ) 320 MG tablet Take 1 tablet (320 mg total) by mouth daily.    Allergies:   Alendronate, Penicillins, Amoxicillin, Amoxicillin-pot clavulanate, and Jardiance  [empagliflozin ]   Social History   Socioeconomic History   Marital status: Married    Spouse name: Alm   Number of children: 3   Years of education: Not on file   Highest education level: Not on file  Occupational History   Not on file  Tobacco Use   Smoking status: Never   Smokeless tobacco: Not on file  Substance and Sexual Activity   Alcohol use: No    Alcohol/week: 0.0 standard drinks of alcohol   Drug use: No   Sexual activity: Not on file  Other Topics Concern   Not on  file  Social History Narrative   Lives with spouse in a home with a Dog (multipoo).   Social Drivers of Corporate investment banker Strain: Low Risk  (02/20/2024)   Overall Financial Resource Strain (CARDIA)    Difficulty of Paying Living Expenses: Not hard at all  Food Insecurity: No Food Insecurity (02/20/2024)   Hunger Vital Sign    Worried About Running Out of Food in the Last Year: Never true    Ran Out of Food in the Last Year: Never true  Transportation Needs: No Transportation Needs (02/20/2024)   PRAPARE - Administrator, Civil Service (Medical): No    Lack of Transportation (Non-Medical): No  Physical Activity: Not on file  Stress: Not on file  Social Connections: Socially Integrated (02/17/2024)   Social Connection and Isolation Panel    Frequency of Communication with Friends and Family: More than three times a week    Frequency of Social Gatherings with Friends and Family: Once a week    Attends Religious Services: More than 4 times per year    Active Member of [employer] or Organizations: Yes     Attends Engineer, structural: More than 4 times per year    Marital Status: Married     Family History:  The patient's family history includes Breast cancer in her cousin; CAD in her mother; Cancer in her sister; Dementia in her father; Hypertension in her father, mother, and another family member; Melanoma in her brother.  ROS:   12-point review of systems is negative unless otherwise noted in the HPI.   EKGs/Labs/Other Studies Reviewed:    Studies reviewed were summarized above. The additional studies were reviewed today:  Limited echo 04/17/2024: 1. Left ventricular ejection fraction, by estimation, is 55 to 60%. The  left ventricle has normal function. Left ventricular endocardial border  not optimally defined to evaluate regional wall motion. Left ventricular  diastolic parameters are consistent  with Grade I diastolic dysfunction (impaired relaxation). The average left  ventricular global longitudinal strain is -18.5 %. The global longitudinal  strain is normal.   2. Right ventricular systolic function is normal. The right ventricular  size is normal. Tricuspid regurgitation signal is inadequate for assessing  PA pressure.   3. The inferior vena cava is normal in size with greater than 50%  respiratory variability, suggesting right atrial pressure of 3 mmHg.   Comparison(s): A prior study was performed on 02/17/2024. The left  ventricular function has improved.  __________  LHC 02/17/2024: Conclusions: No angiographically significant coronary artery disease.  Given severely reduced LVEF by echo with mid and apical wall-motion abnormality, findings are consistent with Takotsubo cardiomyopathy. Moderately elevated left ventricular filling pressure (LVEDP 27 mmHg).   Recommendations: Gentle diuresis and initiation of goal-directed medical therapy. Primary prevention of coronary artery disease. __________   2D echo 02/17/2024: 1. There is LV basilar hyperkinesis  with mid-apical hypokinesis  suggesting stress-induced cardiomyopathy. Left ventricular ejection  fraction, by estimation, is 30 to 35%. The left ventricle has moderate to  severely decreased function. The left ventricle   demonstrates regional wall motion abnormalities (see scoring  diagram/findings for description). Left ventricular diastolic parameters  are indeterminate.   2. Right ventricular systolic function is normal. The right ventricular  size is normal. There is moderately elevated pulmonary artery systolic  pressure.   3. The mitral valve is normal in structure. Mild mitral valve  regurgitation.   4. Tricuspid valve regurgitation is mild to moderate.  5. The aortic valve is tricuspid. Aortic valve regurgitation is not  visualized.   6. The inferior vena cava is normal in size with <50% respiratory  variability, suggesting right atrial pressure of 8 mmHg.  EKG:  EKG is ordered today.  The EKG ordered today demonstrates NSR, 66 bpm, nonspecific and improved ST-T changes  Recent Labs: 02/17/2024: B Natriuretic Peptide 870.3; TSH 1.998 02/23/2024: Magnesium  2.2 03/19/2024: Hemoglobin 14.7; Platelets 241 03/28/2024: BUN 9; Creatinine, Ser 0.75; Potassium 4.8; Sodium 137  Recent Lipid Panel    Component Value Date/Time   CHOL 116 02/17/2024 0337   TRIG 107 02/17/2024 0337   HDL 60 02/17/2024 0337   CHOLHDL 1.9 02/17/2024 0337   VLDL 21 02/17/2024 0337   LDLCALC 35 02/17/2024 0337    PHYSICAL EXAM:    VS:  BP (!) 190/80 (BP Location: Left Arm, Patient Position: Sitting, Cuff Size: Normal)   Pulse 66   Ht 5' 3 (1.6 m)   Wt 136 lb (61.7 kg)   SpO2 99%   BMI 24.09 kg/m   BMI: Body mass index is 24.09 kg/m.  Physical Exam Vitals reviewed.  Constitutional:      Appearance: She is well-developed.  HENT:     Head: Normocephalic and atraumatic.  Eyes:     General:        Right eye: No discharge.        Left eye: No discharge.  Cardiovascular:     Rate and Rhythm:  Normal rate and regular rhythm.     Pulses:          Posterior tibial pulses are 2+ on the right side and 2+ on the left side.     Heart sounds: Normal heart sounds, S1 normal and S2 normal. Heart sounds not distant. No midsystolic click and no opening snap. No murmur heard.    No friction rub.  Pulmonary:     Effort: Pulmonary effort is normal. No respiratory distress.     Breath sounds: Normal breath sounds. No decreased breath sounds, wheezing, rhonchi or rales.  Musculoskeletal:     Cervical back: Normal range of motion.     Right lower leg: No edema.     Left lower leg: No edema.  Skin:    General: Skin is warm and dry.     Nails: There is no clubbing.  Neurological:     Mental Status: She is alert and oriented to person, place, and time.  Psychiatric:        Speech: Speech normal.        Behavior: Behavior normal.        Thought Content: Thought content normal.        Judgment: Judgment normal.     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  Follow-up echo has demonstrated normalization of LV systolic function.  Continue current GDMT including valsartan  320 mg 12.5 mg.  Not needing as needed furosemide .  Given normalization of LV systolic function, and in the context of lack of heart failure symptoms, defer further escalation of pharmacotherapy at this time.    HTN: Blood pressure much improved at home with readings in the 1 teens to 130s mmHg systolic.  Initial blood pressure in the office today elevated at 190/80 with recheck  blood pressure 160 mmHg.  Continue valsartan  320 mg daily, Toprol -XL 12.5 mg daily, and amlodipine  5 mg daily.  Monitor blood pressure.  Patient to bring BP cuff to next visit for comparison.  PSVT: Continues to note intermittent paroxysms of palpitations, typically  when she is resting and there are not many stimuli around her.  Remains on XL 12.5 mg daily.  HLD: LDL 35 with no evidence of CAD on LHC.  Remains on rosuvastatin  5 mg.     Disposition: F/u with Dr. Jeffrie or an APP in 58month to recheck BP.   Medication Adjustments/Labs and Tests Ordered: Current medicines are reviewed at length with the patient today.  Concerns regarding medicines are outlined above. Medication changes, Labs and Tests ordered today are summarized above and listed in the Patient Instructions accessible in Encounters.   Signed, Bernardino Bring, PA-C 04/24/2024 12:56 PM     [facility] [place] [address] [place], [place] [address] [phone]

## 2024-04-24 NOTE — Patient Instructions (Signed)
 Medication Instructions:  Your physician recommends that you continue on your current medications as directed. Please refer to the Current Medication list given to you today.   *If you need a refill on your cardiac medications before your next appointment, please call your pharmacy*  Lab Work: None ordered at this time   Follow-Up: At [facility], you and your health needs are our priority.  As part of our continuing mission to provide you with exceptional heart care, our providers are all part of one team.  This team includes your primary Cardiologist (physician) and Advanced Practice Providers or APPs (Physician Assistants and Nurse Practitioners) who all work together to provide you with the care you need, when you need it.  Your next appointment:   1 month(s)  Provider:   You may see Oneil Parchment, MD or Bernardino Bring, PA-C   Please bring your BP cuff to this appointment

## 2024-04-25 ENCOUNTER — Other Ambulatory Visit: Payer: Self-pay

## 2024-05-15 ENCOUNTER — Other Ambulatory Visit: Payer: Self-pay | Admitting: Physician Assistant

## 2024-05-17 MED ORDER — METOPROLOL SUCCINATE ER 25 MG PO TB24: 12.5000 mg | ORAL_TABLET | Freq: Every day | ORAL | 3 refills | Status: AC

## 2024-05-17 MED ORDER — METOPROLOL SUCCINATE ER 25 MG PO TB24: 12.5000 mg | ORAL_TABLET | Freq: Every day | ORAL | 3 refills | Status: DC

## 2024-05-17 NOTE — Addendum Note (Signed)
 Addended by: DESIDERIO RUSSELL SAILOR on: 05/17/2024 03:40 PM   Modules accepted: Orders

## 2024-05-17 NOTE — Telephone Encounter (Signed)
 As per last office visit note from RD - Toprol -XL refill sent to pt's pharm

## 2024-05-17 NOTE — Addendum Note (Signed)
 Addended by: HANNAH MARCUS KIDD on: 05/17/2024 06:10 PM   Modules accepted: Orders

## 2024-05-17 NOTE — Telephone Encounter (Signed)
 Please advise if ok to refill Metoprolol  succinate 25 mg tablet 1/2 tablet daily. Last filled by Dr. Sonjia.

## 2024-05-19 DIAGNOSIS — Z23 Encounter for immunization: Secondary | ICD-10-CM | POA: Diagnosis not present

## 2024-05-21 ENCOUNTER — Encounter

## 2024-05-27 NOTE — Progress Notes (Unsigned)
 Cardiology Office Note    Date:  05/28/2024   ID:  Laura, Cohen 1956-03-21, MRN [id number]  PCP:  Loreli Kins, MD  Cardiologist:  Oneil Parchment, MD  Electrophysiologist:  None   Chief Complaint: Follow up  History of Present Illness:   Laura Cohen is a 68 y.o. 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   She was evaluated in our office in 09/2021 for palpitations. 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 Zio patch in 09/2021 showed a predominant rhythm of sinus with an average rate of 66 bpm, 1 run of NSVT lasting 6 beats with a rate of 118 bpm, 13 episodes of atrial tachycardia lasting up to 14 beats with an average rate of 124 bpm, and rare PACs and PVCs. Echo in 09/2021 showed an EF of 65 to 70%, no regional wall motion abnormalities, grade 1 diastolic dysfunction, normal RV systolic function, ventricular cavity size, and RVSP, trivial mitral regurgitation, and an estimated right atrial pressure of 3 mmHg.    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 This was followed by a sensation of diffuse attention and her muscles associated with chest tightness radiating to the back and tachypalpitations. Leading up to these events, she was without symptoms of angina or cardiac decompensation and had her typical baseline.  The symptoms persisted prompting her to present to [facility] where she was admitted with concern for NSTEMI initially with initial EKG showing sinus rhythm with possible prior anterior infarct, prolonged QT, and inferolateral T wave inversion.  Initial and peak high-sensitivity troponin 1187.  D-dimer negative.  Echo showed an EF of 30 to 35% with LV basilar hypokinesis with mid apical hypokinesis suggestive of stress-induced cardiomyopathy, normal RV  systolic function and ventricular cavity size, moderately elevated RVSP, mild mitral regurgitation, mild to moderate tricuspid regurgitation, and an estimated right atrial pressure of 8 mmHg.  LHC showed no angiographically significant CAD with moderately elevated LVEDP estimated at 27 mmHg.  Findings were consistent with Takotsubo cardiomyopathy.  Admission was also notable for runs of paroxysmal SVT requiring amiodarone  IV amiodarone  with transition to oral amiodarone .  Escalation of GDMT was limited by hypotension.    She was seen in hospital follow-up on 02/29/2024 and was without symptoms of angina or cardiac decompensation.  She did report some head fullness and dizziness after having been started on Jardiance , leading to self discontinuation.  Zio patch showed a predominant rhythm of sinus with an average rate of 51 bpm with 4 episodes of SVT lasting up to 13.4 seconds.  She was seen in the ED on 03/19/2024 with elevated blood pressure into the 190s mmHg systolic at home leading to several adjustments in antihypertensive therapy thereafter.  Echo on 04/17/2024 showed normalization of LV systolic function with an EF of 55 to 60%, grade 1 diastolic dysfunction, normal RV systolic function and ventricular cavity size, and an estimated right atrial pressure of 3 mmHg.  She was last seen in the office in 04/2024 and was doing well from a cardiac perspective.  Blood pressure was much better controlled in the 130s to 1 teens mmHg systolic with recommendation to continue valsartan  320 mg, Toprol -XL 12.5 mg, and amlodipine  5 mg.  She comes in doing very well from a cardiac perspective and is without symptoms of  angina or cardiac decompensation.  Palpitations well-controlled.  No dizziness, presyncope, or syncope.  No lower extremity swelling or progressive orthopnea.  Blood pressure is well-controlled at home largely in the low 100s to 1 teens mmHg systolic with a rare reading of 99 mmHg systolic.  Energy has  returned back to baseline.  Palpitations quiescent.  Feels very well at this time and does not have any acute cardiac concerns.   Labs independently reviewed: 03/2024 - BUN 9, serum creatinine 0.75, potassium 4.8, Hgb 14.7, PLT 241 01/2024 - magnesium  2.2, TSH normal, BNP 870, TC 116, TG 107, HDL 60, LDL 35, LP(a) 14.9   Past Medical History:  Diagnosis Date   Hematuria    Hemorrhoids    Hyperlipidemia    Hypertension    Insomnia    Menopausal symptoms    Osteopenia     Past Surgical History:  Procedure Laterality Date   ABDOMINAL HYSTERECTOMY     BREAST EXCISIONAL BIOPSY     BREAST SURGERY     LEFT HEART CATH AND CORONARY ANGIOGRAPHY N/A 02/17/2024   Procedure: LEFT HEART CATH AND CORONARY ANGIOGRAPHY;  Surgeon: Mady Bruckner, MD;  Location: [facility] INVASIVE CV LAB;  Service: Cardiovascular;  Laterality: N/A;    Current Medications: Current Meds  Medication Sig   acetaminophen  (TYLENOL ) 500 MG tablet Take 1,000 mg by mouth every 8 (eight) hours as needed for moderate pain (pain score 4-6).   amLODipine  (NORVASC ) 10 MG tablet Take 0.5 tablets (5 mg total) by mouth daily.   aspirin  EC 81 MG tablet Take 81 mg by mouth daily. Swallow whole.   B Complex Vitamins (VITAMIN B COMPLEX PO) Take 1 tablet by mouth daily.   BIOTIN PO Take 2 each by mouth daily.   co-enzyme Q-10 30 MG capsule Take 30 mg by mouth 3 (three) times daily.   diazepam (VALIUM) 2 MG tablet Take 2 mg by mouth as needed for anxiety.   glucosamine-chondroitin 500-400 MG tablet Take 1 tablet by mouth 3 (three) times daily. (Patient taking differently: Take 1 tablet by mouth as needed.)   metoprolol  succinate (TOPROL -XL) 25 MG 24 hr tablet Take 0.5 tablets (12.5 mg total) by mouth daily.   Misc Natural Products (TURMERIC, CURCUMIN, PO) Take 2 each by mouth daily.   Multiple Vitamin (MULTIVITAMIN) tablet Take 1 tablet by mouth daily.   Omega-3 Fatty Acids (FISH OIL PO) Take 1 capsule by mouth daily. (Patient taking  differently: Take 1 capsule by mouth as needed.)   rosuvastatin  (CRESTOR ) 5 MG tablet Take 5 mg by mouth daily.   sertraline (ZOLOFT) 50 MG tablet Take 50 mg by mouth daily.   valsartan  (DIOVAN ) 320 MG tablet Take 1 tablet (320 mg total) by mouth daily.    Allergies:   Alendronate, Penicillins, Amoxicillin, Amoxicillin-pot clavulanate, and Jardiance  [empagliflozin ]   Social History   Socioeconomic History   Marital status: Married    Spouse name: Alm   Number of children: 3   Years of education: Not on file   Highest education level: Not on file  Occupational History   Not on file  Tobacco Use   Smoking status: Never   Smokeless tobacco: Not on file  Substance and Sexual Activity   Alcohol use: No    Alcohol/week: 0.0 standard drinks of alcohol   Drug use: No   Sexual activity: Not on file  Other Topics Concern   Not on file  Social History Narrative   Lives with spouse in a home with  a Dog (multipoo).   Social Drivers of Corporate Investment Banker Strain: Low Risk  (02/20/2024)   Overall Financial Resource Strain (CARDIA)    Difficulty of Paying Living Expenses: Not hard at all  Food Insecurity: No Food Insecurity (02/20/2024)   Hunger Vital Sign    Worried About Running Out of Food in the Last Year: Never true    Ran Out of Food in the Last Year: Never true  Transportation Needs: No Transportation Needs (02/20/2024)   PRAPARE - Administrator, Civil Service (Medical): No    Lack of Transportation (Non-Medical): No  Physical Activity: Not on file  Stress: Not on file  Social Connections: Socially Integrated (02/17/2024)   Social Connection and Isolation Panel    Frequency of Communication with Friends and Family: More than three times a week    Frequency of Social Gatherings with Friends and Family: Once a week    Attends Religious Services: More than 4 times per year    Active Member of [employer] or Organizations: Yes    Attends Hospital Doctor: More than 4 times per year    Marital Status: Married     Family History:  The patient's family history includes Breast cancer in her cousin; CAD in her mother; Cancer in her sister; Dementia in her father; Hypertension in her father, mother, and another family member; Melanoma in her brother.  ROS:   12-point review of systems is negative unless otherwise noted in the HPI.   EKGs/Labs/Other Studies Reviewed:    Studies reviewed were summarized above. The additional studies were reviewed today:  Limited echo 04/17/2024: 1. Left ventricular ejection fraction, by estimation, is 55 to 60%. The  left ventricle has normal function. Left ventricular endocardial border  not optimally defined to evaluate regional wall motion. Left ventricular  diastolic parameters are consistent  with Grade I diastolic dysfunction (impaired relaxation). The average left  ventricular global longitudinal strain is -18.5 %. The global longitudinal  strain is normal.   2. Right ventricular systolic function is normal. The right ventricular  size is normal. Tricuspid regurgitation signal is inadequate for assessing  PA pressure.   3. The inferior vena cava is normal in size with greater than 50%  respiratory variability, suggesting right atrial pressure of 3 mmHg.   Comparison(s): A prior study was performed on 02/17/2024. The left  ventricular function has improved.  __________   LHC 02/17/2024: Conclusions: No angiographically significant coronary artery disease.  Given severely reduced LVEF by echo with mid and apical wall-motion abnormality, findings are consistent with Takotsubo cardiomyopathy. Moderately elevated left ventricular filling pressure (LVEDP 27 mmHg).   Recommendations: Gentle diuresis and initiation of goal-directed medical therapy. Primary prevention of coronary artery disease. __________   2D echo 02/17/2024: 1. There is LV basilar hyperkinesis with mid-apical hypokinesis   suggesting stress-induced cardiomyopathy. Left ventricular ejection  fraction, by estimation, is 30 to 35%. The left ventricle has moderate to  severely decreased function. The left ventricle   demonstrates regional wall motion abnormalities (see scoring  diagram/findings for description). Left ventricular diastolic parameters  are indeterminate.   2. Right ventricular systolic function is normal. The right ventricular  size is normal. There is moderately elevated pulmonary artery systolic  pressure.   3. The mitral valve is normal in structure. Mild mitral valve  regurgitation.   4. Tricuspid valve regurgitation is mild to moderate.   5. The aortic valve is tricuspid. Aortic valve regurgitation is not  visualized.   6. The inferior vena cava is normal in size with <50% respiratory  variability, suggesting right atrial pressure of 8 mmHg.    EKG:  EKG is not ordered today.    Recent Labs: 02/17/2024: B Natriuretic Peptide 870.3; TSH 1.998 02/23/2024: Magnesium  2.2 03/19/2024: Hemoglobin 14.7; Platelets 241 03/28/2024: BUN 9; Creatinine, Ser 0.75; Potassium 4.8; Sodium 137  Recent Lipid Panel    Component Value Date/Time   CHOL 116 02/17/2024 0337   TRIG 107 02/17/2024 0337   HDL 60 02/17/2024 0337   CHOLHDL 1.9 02/17/2024 0337   VLDL 21 02/17/2024 0337   LDLCALC 35 02/17/2024 0337    PHYSICAL EXAM:    VS:  BP 132/81 (BP Location: Left Arm, Patient Position: Sitting, Cuff Size: Normal) Comment (Patient Position): pt in chair with feet on floor  Pulse 65   Ht 5' 3 (1.6 m)   Wt 141 lb 3.2 oz (64 kg)   SpO2 99%   BMI 25.01 kg/m   BMI: Body mass index is 25.01 kg/m.  Physical Exam Vitals reviewed.  Constitutional:      Appearance: She is well-developed.  HENT:     Head: Normocephalic and atraumatic.  Eyes:     General:        Right eye: No discharge.        Left eye: No discharge.  Cardiovascular:     Rate and Rhythm: Normal rate and regular rhythm.     Heart  sounds: Normal heart sounds, S1 normal and S2 normal. Heart sounds not distant. No midsystolic click and no opening snap. No murmur heard.    No friction rub.  Pulmonary:     Effort: Pulmonary effort is normal. No respiratory distress.     Breath sounds: Normal breath sounds. No decreased breath sounds, wheezing, rhonchi or rales.  Musculoskeletal:     Cervical back: Normal range of motion.     Right lower leg: No edema.     Left lower leg: No edema.  Skin:    General: Skin is warm and dry.     Nails: There is no clubbing.  Neurological:     Mental Status: She is alert and oriented to person, place, and time.  Psychiatric:        Speech: Speech normal.        Behavior: Behavior normal.        Thought Content: Thought content normal.        Judgment: Judgment normal.     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 Follow-up echo has demonstrated normalization of LV systolic function. Continue current GDMT including valsartan  320 mg and Toprol -XL 12.5 mg.  No longer on SGLT2 inhibitor secondary to intolerance.  Not needing as needed furosemide . Given normalization of LV systolic function, and in the context of lack of heart failure symptoms, defer further escalation of pharmacotherapy at this time.   HTN: Blood pressure is elevated in the office today though well-controlled at home.  Suspect there is a component of whitecoat hypertension.  Continue valsartan  320 mg daily, Toprol -XL 12.5 mg, and amlodipine  5 mg.  PSVT: Quiescent on Toprol -XL 12.5 mg.  HLD: LDL 35 with no evidence of CAD on LHC.  She remains on rosuvastatin  5 mg.  Disposition: F/u with Dr. Jeffrie or an APP in 6 months.   Medication Adjustments/Labs and Tests Ordered: Current medicines are reviewed at length with the patient  today.  Concerns regarding medicines are outlined above. Medication changes, Labs and Tests ordered today are summarized above and listed in the Patient Instructions accessible in Encounters.   Signed, Bernardino Bring, PA-C 05/28/2024 12:59 PM     [facility] [address] [place], [place] [address] [phone]

## 2024-05-28 ENCOUNTER — Ambulatory Visit: Attending: Physician Assistant | Admitting: Physician Assistant

## 2024-05-28 ENCOUNTER — Encounter: Payer: Self-pay | Admitting: Physician Assistant

## 2024-05-28 VITALS — BP 132/81 | HR 65 | Ht 63.0 in | Wt 141.2 lb

## 2024-05-28 DIAGNOSIS — I5181 Takotsubo syndrome: Secondary | ICD-10-CM

## 2024-05-28 DIAGNOSIS — I471 Supraventricular tachycardia, unspecified: Secondary | ICD-10-CM | POA: Diagnosis not present

## 2024-05-28 DIAGNOSIS — I5020 Unspecified systolic (congestive) heart failure: Secondary | ICD-10-CM | POA: Diagnosis not present

## 2024-05-28 DIAGNOSIS — I10 Essential (primary) hypertension: Secondary | ICD-10-CM | POA: Diagnosis not present

## 2024-05-28 DIAGNOSIS — E782 Mixed hyperlipidemia: Secondary | ICD-10-CM

## 2024-05-28 NOTE — Patient Instructions (Signed)
 Medication Instructions:  Your physician recommends that you continue on your current medications as directed. Please refer to the Current Medication list given to you today.   *If you need a refill on your cardiac medications before your next appointment, please call your pharmacy*  Lab Work: None ordered at this time   Follow-Up: At [facility], you and your health needs are our priority.  As part of our continuing mission to provide you with exceptional heart care, our providers are all part of one team.  This team includes your primary Cardiologist (physician) and Advanced Practice Providers or APPs (Physician Assistants and Nurse Practitioners) who all work together to provide you with the care you need, when you need it.  Your next appointment:   5 month(s)  Provider:   You may see Oneil Parchment, MD or Bernardino Bring, PA-C

## 2024-05-30 ENCOUNTER — Encounter: Payer: Self-pay | Admitting: Family

## 2024-06-06 ENCOUNTER — Ambulatory Visit (HOSPITAL_BASED_OUTPATIENT_CLINIC_OR_DEPARTMENT_OTHER)
Admission: RE | Admit: 2024-06-06 | Discharge: 2024-06-06 | Disposition: A | Discharge status: Home / Self Care | Source: Ambulatory Visit | Attending: Family Medicine | Admitting: Family Medicine

## 2024-06-06 DIAGNOSIS — M8589 Other specified disorders of bone density and structure, multiple sites: Secondary | ICD-10-CM | POA: Insufficient documentation

## 2024-06-06 DIAGNOSIS — M8580 Other specified disorders of bone density and structure, unspecified site: Secondary | ICD-10-CM

## 2024-06-06 DIAGNOSIS — Z780 Asymptomatic menopausal state: Secondary | ICD-10-CM | POA: Insufficient documentation

## 2024-06-13 DIAGNOSIS — K080 Exfoliation of teeth due to systemic causes: Secondary | ICD-10-CM | POA: Diagnosis not present

## 2024-10-24 ENCOUNTER — Ambulatory Visit: Admitting: Physician Assistant
